# Patient Record
Sex: Male | Born: 1980 | Race: White | Hispanic: No | Marital: Married | Smoking: Former smoker
Health system: Southern US, Community
[De-identification: ages and names within clinical notes are randomized; demographics above are authoritative.]

## PROBLEM LIST (undated history)

## (undated) DIAGNOSIS — K219 Gastro-esophageal reflux disease without esophagitis: Secondary | ICD-10-CM

## (undated) DIAGNOSIS — M7030 Other bursitis of elbow, unspecified elbow: Secondary | ICD-10-CM

## (undated) DIAGNOSIS — L409 Psoriasis, unspecified: Secondary | ICD-10-CM

## (undated) DIAGNOSIS — Z872 Personal history of diseases of the skin and subcutaneous tissue: Secondary | ICD-10-CM

## (undated) DIAGNOSIS — T7840XA Allergy, unspecified, initial encounter: Secondary | ICD-10-CM

## (undated) DIAGNOSIS — B019 Varicella without complication: Secondary | ICD-10-CM

## (undated) HISTORY — DX: Personal history of diseases of the skin and subcutaneous tissue: Z87.2

## (undated) HISTORY — PX: CYSTECTOMY: SUR359

## (undated) HISTORY — DX: Varicella without complication: B01.9

## (undated) HISTORY — DX: Other bursitis of elbow, unspecified elbow: M70.30

## (undated) HISTORY — DX: Allergy, unspecified, initial encounter: T78.40XA

## (undated) HISTORY — DX: Psoriasis, unspecified: L40.9

---
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
## 2002-09-20 ENCOUNTER — Inpatient Hospital Stay (HOSPITAL_COMMUNITY)
Admission: RE | Admit: 2002-09-20 | Discharge: 2002-10-27 | Payer: Self-pay | Admitting: Physical Medicine & Rehabilitation

## 2002-09-21 ENCOUNTER — Encounter: Payer: Self-pay | Admitting: Physical Medicine & Rehabilitation

## 2002-09-26 ENCOUNTER — Encounter: Payer: Self-pay | Admitting: Physical Medicine & Rehabilitation

## 2002-09-27 ENCOUNTER — Encounter: Payer: Self-pay | Admitting: Physical Medicine & Rehabilitation

## 2002-09-30 ENCOUNTER — Encounter: Payer: Self-pay | Admitting: Neurosurgery

## 2002-10-14 ENCOUNTER — Encounter: Payer: Self-pay | Admitting: Physical Medicine & Rehabilitation

## 2002-10-24 ENCOUNTER — Encounter: Payer: Self-pay | Admitting: Neurosurgery

## 2002-11-01 ENCOUNTER — Encounter
Admission: RE | Admit: 2002-11-01 | Discharge: 2003-01-11 | Payer: Self-pay | Admitting: Physical Medicine & Rehabilitation

## 2002-12-30 ENCOUNTER — Encounter
Admission: RE | Admit: 2002-12-30 | Discharge: 2003-03-30 | Payer: Self-pay | Admitting: Physical Medicine & Rehabilitation

## 2003-01-11 ENCOUNTER — Ambulatory Visit (HOSPITAL_COMMUNITY)
Admission: RE | Admit: 2003-01-11 | Discharge: 2003-01-11 | Payer: Self-pay | Admitting: Physical Medicine & Rehabilitation

## 2003-01-12 ENCOUNTER — Encounter
Admission: RE | Admit: 2003-01-12 | Discharge: 2003-04-12 | Payer: Self-pay | Admitting: Physical Medicine & Rehabilitation

## 2004-08-16 ENCOUNTER — Ambulatory Visit: Payer: Self-pay | Admitting: Physical Medicine & Rehabilitation

## 2004-08-16 ENCOUNTER — Encounter
Admission: RE | Admit: 2004-08-16 | Discharge: 2004-11-14 | Payer: Self-pay | Admitting: Physical Medicine & Rehabilitation

## 2008-08-11 ENCOUNTER — Ambulatory Visit: Payer: Self-pay | Admitting: Family Medicine

## 2011-01-15 ENCOUNTER — Ambulatory Visit: Payer: Self-pay | Admitting: Gastroenterology

## 2012-08-06 ENCOUNTER — Encounter: Payer: Self-pay | Admitting: *Deleted

## 2012-08-30 ENCOUNTER — Other Ambulatory Visit: Payer: Self-pay | Admitting: General Surgery

## 2012-08-30 ENCOUNTER — Encounter: Payer: Self-pay | Admitting: General Surgery

## 2012-08-30 ENCOUNTER — Ambulatory Visit (INDEPENDENT_AMBULATORY_CARE_PROVIDER_SITE_OTHER): Payer: BC Managed Care – PPO | Admitting: General Surgery

## 2012-08-30 VITALS — BP 124/72 | HR 76 | Resp 12 | Ht 72.0 in | Wt 222.0 lb

## 2012-08-30 DIAGNOSIS — L0591 Pilonidal cyst without abscess: Secondary | ICD-10-CM

## 2012-08-30 NOTE — Patient Instructions (Addendum)
Patient to have an pilonidal cyst removed.  Patient's surgery has been scheduled for 09-10-12 at [facility].

## 2012-08-30 NOTE — Progress Notes (Signed)
Patient ID: Marcus Clayton, male   DOB: [birth year], 32 y.o.   MRN: [id number]  Chief Complaint  Patient presents with  . Other    pilonidal cyst    HPI Marcus Clayton is a 32 y.o. male here today for an evaluation of an pilonidal cyst.  He states its been there for about an year. He has had it drained in the past the last time was two months ago.   tthe patient reports the first episode of inflammation occurred 2 years ago and was accompanied by spontaneous rupture drainage. He had a recurrent episode one year ago required incision and drainage. A minor flareup occurred a few months ago. 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 The patient was accompanied today by his wife for 4 years (elementary school music teacher in [employer])  was present for the interview and exam.   HPI  History reviewed. No pertinent past medical history.  Past Surgical History  Procedure Laterality Date  . Lung collapsed  2000    No family history on file.  Social History History  Substance Use Topics  . Smoking status: Never Smoker   . Smokeless tobacco: Never Used  . Alcohol Use: No    No Known Allergies  Current Outpatient Prescriptions  Medication Sig Dispense Refill  . fluticasone (FLONASE) 50 MCG/ACT nasal spray Place 2 sprays into the nose as needed for rhinitis.      . Pseudoephedrine HCl (SINUS & ALLERGY 12 HOUR PO) Take 1 tablet by mouth as needed.       No current facility-administered medications for this visit.    Review of Systems Review of Systems  Constitutional: Negative.   Respiratory: Negative.   Cardiovascular: Negative.   Gastrointestinal: Negative.     Blood pressure 124/72, pulse 76, resp. rate 12, height 6' (1.829 m), weight 222 lb (100.699 kg).  Physical Exam Physical Exam  Constitutional: He is oriented to person, place, and time. He appears well-developed and well-nourished.  Cardiovascular: Normal rate, regular  rhythm and normal heart sounds.   Pulmonary/Chest: Breath sounds normal.  Genitourinary:  Pilonidal cyst  Healed well. multiple pits   Lymphadenopathy:    He has no cervical adenopathy.  Neurological: He is alert and oriented to person, place, and time.  Skin: Skin is warm and dry.       Data Reviewed Walk-in clinic notes of August 03, 2012 describes early abscess on the right buttock treated with Septra DS.  Notes of August 06, 2012 described incision and drainage being completed.  Assessment    Pilonidal cyst with recurrent abscesses.     Plan    Indication for operative excision of the natal cleft and closure of the defect was reviewed. The patient may well benefit from flap closure to minimize the risk of recurrence.    Patient's surgery has been scheduled for 09-10-12 at [facility].   Earline Mayotte 08/30/2012, 7:12 PM

## 2012-09-10 ENCOUNTER — Ambulatory Visit: Payer: Self-pay | Admitting: General Surgery

## 2012-09-10 DIAGNOSIS — L0501 Pilonidal cyst with abscess: Secondary | ICD-10-CM

## 2012-09-10 HISTORY — PX: PILONIDAL CYST EXCISION: SHX744

## 2012-09-17 ENCOUNTER — Encounter: Payer: Self-pay | Admitting: General Surgery

## 2012-09-22 ENCOUNTER — Encounter: Payer: Self-pay | Admitting: General Surgery

## 2012-09-22 ENCOUNTER — Ambulatory Visit (INDEPENDENT_AMBULATORY_CARE_PROVIDER_SITE_OTHER): Payer: BC Managed Care – PPO | Admitting: General Surgery

## 2012-09-22 VITALS — BP 98/68 | HR 84 | Resp 12 | Ht 72.0 in | Wt 224.0 lb

## 2012-09-22 DIAGNOSIS — L0591 Pilonidal cyst without abscess: Secondary | ICD-10-CM

## 2012-09-22 NOTE — Progress Notes (Signed)
Patient ID: Marcus Clayton, male   DOB: 14-Nov-1980, 32 y.o.   MRN: [id number]  Chief Complaint  Patient presents with  . Routine Post Op    pilonidal cyst    HPI Marcus Clayton is a 32 y.o. male here for his post op pilonidal cyst done on 09/10/12. Patient states he is better.  He states he has not noticed any drainage. HPI  History reviewed. No pertinent past medical history.  Past Surgical History  Procedure Laterality Date  . Lung collapsed  2000    No family history on file.  Social History History  Substance Use Topics  . Smoking status: Never Smoker   . Smokeless tobacco: Never Used  . Alcohol Use: No    No Known Allergies  Current Outpatient Prescriptions  Medication Sig Dispense Refill  . fluticasone (FLONASE) 50 MCG/ACT nasal spray Place 2 sprays into the nose as needed for rhinitis.      . Pseudoephedrine HCl (SINUS & ALLERGY 12 HOUR PO) Take 1 tablet by mouth as needed.       No current facility-administered medications for this visit.    Review of Systems Review of Systems  Constitutional: Negative.   Respiratory: Negative.   Cardiovascular: Negative.     Blood pressure 98/68, pulse 84, resp. rate 12, height 6' (1.829 m), weight 224 lb (101.606 kg).  Physical Exam Physical Exam  Constitutional: He is oriented to person, place, and time. He appears well-developed and well-nourished.  Neurological: He is alert and oriented to person, place, and time.  Skin: Skin is warm and dry.  Well healed area Sutures removed    Data Reviewed Pathology showed a pilonidal cyst.  Assessment    Doing well status post rotation flap coverage a pilonidal cyst.    Plan    Final followup in 3 weeks. Sutures were removed by the CMA.       Earline Mayotte 09/24/2012, 5:30 PM

## 2012-09-22 NOTE — Patient Instructions (Addendum)
Call for questions or concerns May return to work

## 2012-09-24 ENCOUNTER — Encounter: Payer: Self-pay | Admitting: General Surgery

## 2012-10-04 ENCOUNTER — Encounter: Payer: Self-pay | Admitting: General Surgery

## 2012-10-05 ENCOUNTER — Ambulatory Visit (INDEPENDENT_AMBULATORY_CARE_PROVIDER_SITE_OTHER): Payer: BC Managed Care – PPO | Admitting: General Surgery

## 2012-10-05 ENCOUNTER — Encounter: Payer: Self-pay | Admitting: General Surgery

## 2012-10-05 VITALS — BP 112/74 | HR 82 | Resp 14 | Ht 72.0 in | Wt 227.0 lb

## 2012-10-05 DIAGNOSIS — L0591 Pilonidal cyst without abscess: Secondary | ICD-10-CM

## 2012-10-05 NOTE — Progress Notes (Signed)
aPatient ID: Marcus Clayton, male   DOB: July 14, 1980, 32 y.o.   MRN: [id number]  Chief Complaint  Patient presents with  . Follow-up    pilonidal cyst    HPI Marcus Clayton is a 32 y.o. male  For follow up from a pilonidal cyst repair done on 09-10-12. The patient had a follow up appointment with suture removal on 09/22/12. The patient states that he has developed some swelling on either side of the incision area about 3 days ago. He states the areas are red, swollen, and painful.  HPI  History reviewed. No pertinent past medical history.  Past Surgical History  Procedure Laterality Date  . Lung collapsed  2000  . Pilonidal cyst excision  09/10/12    No family history on file.  Social History History  Substance Use Topics  . Smoking status: Never Smoker   . Smokeless tobacco: Never Used  . Alcohol Use: No    No Known Allergies  Current Outpatient Prescriptions  Medication Sig Dispense Refill  . fluticasone (FLONASE) 50 MCG/ACT nasal spray Place 2 sprays into the nose as needed for rhinitis.      . Pseudoephedrine HCl (SINUS & ALLERGY 12 HOUR PO) Take 1 tablet by mouth as needed.       No current facility-administered medications for this visit.    Review of Systems Review of Systems  Constitutional: Negative.   Respiratory: Negative.   Cardiovascular: Negative.     Blood pressure 112/74, pulse 82, resp. rate 14, height 6' (1.829 m), weight 227 lb (102.967 kg).  Physical Exam Physical Exam  Constitutional: He is oriented to person, place, and time. He appears well-developed and well-nourished.  Neurological: He is alert and oriented to person, place, and time.  Examination of the pilonidal cyst excision site shows mild residual edema superiorly, but no erythema, induration or fluctuance. Excellent healing of the wound is appreciated.    Assessment    No evidence of complication status post pilonidal cyst excision.     Plan    The patient will follow up as  previously scheduled later in the month. Local heat may be applied for comfort if needed.        Marcus Clayton 10/06/2012, 10:19 PM

## 2012-10-05 NOTE — Patient Instructions (Signed)
Follow up as scheduled.  

## 2012-10-06 ENCOUNTER — Encounter: Payer: Self-pay | Admitting: General Surgery

## 2012-10-13 ENCOUNTER — Encounter: Payer: Self-pay | Admitting: General Surgery

## 2012-10-13 ENCOUNTER — Ambulatory Visit (INDEPENDENT_AMBULATORY_CARE_PROVIDER_SITE_OTHER): Payer: BC Managed Care – PPO | Admitting: General Surgery

## 2012-10-13 VITALS — BP 110/78 | HR 86 | Resp 14 | Ht 72.0 in | Wt 226.0 lb

## 2012-10-13 DIAGNOSIS — L0591 Pilonidal cyst without abscess: Secondary | ICD-10-CM

## 2012-10-13 NOTE — Patient Instructions (Addendum)
Patient to return as needed. 

## 2012-10-13 NOTE — Progress Notes (Signed)
Patient ID: Marcus Clayton, male   DOB: [birth year], 32 y.o.   MRN: [id number]  Chief Complaint  Patient presents with  . Routine Post Op    pilonidal cyst    HPI Marcus Clayton is a 32 y.o. male follow up from a pilonidal cyst repair done on 09-10-12. The patient had a follow up appointment with suture removal on 09/22/12. Patient states no problems at this time.   HPI  No past medical history on file.  Past Surgical History  Procedure Laterality Date  . Lung collapsed  2000  . Pilonidal cyst excision  09/10/12  . Cystectomy  09/10/12.    No family history on file.  Social History History  Substance Use Topics  . Smoking status: Never Smoker   . Smokeless tobacco: Never Used  . Alcohol Use: No    No Known Allergies  Current Outpatient Prescriptions  Medication Sig Dispense Refill  . fluticasone (FLONASE) 50 MCG/ACT nasal spray Place 2 sprays into the nose as needed for rhinitis.      . Pseudoephedrine HCl (SINUS & ALLERGY 12 HOUR PO) Take 1 tablet by mouth as needed.       No current facility-administered medications for this visit.    Review of Systems Review of Systems  Blood pressure 110/78, pulse 86, resp. rate 14, height 6' (1.829 m), weight 226 lb (102.513 kg).  Physical Exam Physical Exam Examination shows excellent healing of the pilonidal cyst excision site. No induration, thickening or drainage. Data Reviewed Pathology previously reviewed and benign.  Assessment    Doing well status post Tylenol cyst excision.    Plan    Patient will followup on an as-needed basis.       Earline Mayotte 10/14/2012, 6:00 PM

## 2013-09-28 ENCOUNTER — Ambulatory Visit: Payer: Self-pay | Admitting: Urology

## 2014-05-12 NOTE — Op Note (Signed)
PATIENT NAME:  Marcus Clayton, Marcus Clayton MR#:  [id number] DATE OF BIRTH:  1980-12-10  DATE OF PROCEDURE:  09/10/2012  PREOPERATIVE DIAGNOSIS: Pilonidal cyst.   POSTOPERATIVE DIAGNOSIS: Pilonidal cyst.   OPERATIVE PROCEDURE: Pilonidal cystectomy with rotation flap coverage.   SURGEON: Robert Bellow, M.D.   ANESTHESIA: General endotracheal under Dr. Benjamine Mola, Marcaine 0.5% with 1:200,000 units local infiltration.   ESTIMATED BLOOD LOSS: Minimal.   CLINICAL NOTE: This 34 year old male has had several episodes of pilonidal cyst infection. Examination showed multiple pits extending over a 4 to 5 cm distance of the natal cleft. He was felt to be a candidate for pilonidal cystectomy. He received Invanz prior to the procedure.   OPERATIVE NOTE: With the patient under adequate general endotracheal anesthesia and rolled into the prone position with appropriate padding, the edges of the natal cleft were marked when apposed and then the buttocks taped apart. The area was prepped with Betadine solution and draped. Marcaine was infiltrated for postoperative analgesia. It was elected to excise the skin of the natal cleft and eventually a 2.5 cm area of additional skin to allow movement of the incision to the right of the midline and obliterate the depth of the natal cleft. Skin was incised sharply and hemostasis achieved with electrocautery. This was carried down to the presacral fascia. No abscesses were identified. The skin was mobilized approximately 4 cm on either side of the midline and the redundant skin excised after removing the tape from the buttock skin. The adipose tissue was approximated with interrupted 2-0 Vicryl figure-of-eight sutures. The skin was then approximated with a running 3-0 Vicryl subcuticular suture. Additional 5-0 nylon sutures were used for final apposition. Telfa and Tegaderm dressing was applied and the patient taken to the recovery room in stable condition.    ____________________________ Robert Bellow, MD jwb:gb D: 09/10/2012 19:35:18 ET T: 09/10/2012 21:17:02 ET JOB#: [id number]  cc: Robert Bellow, MD, <Dictator> Milinda Pointer. Jacqualine Code, MD Tashon Capp Amedeo Kinsman MD ELECTRONICALLY SIGNED 09/13/2012 13:11

## 2015-02-14 ENCOUNTER — Other Ambulatory Visit: Payer: Self-pay | Admitting: Family Medicine

## 2015-02-14 ENCOUNTER — Encounter: Payer: Self-pay | Admitting: Family Medicine

## 2015-02-14 ENCOUNTER — Ambulatory Visit (INDEPENDENT_AMBULATORY_CARE_PROVIDER_SITE_OTHER): Payer: BC Managed Care – PPO | Admitting: Family Medicine

## 2015-02-14 VITALS — BP 126/82 | HR 77 | Temp 98.1°F | Ht 72.0 in | Wt 206.2 lb

## 2015-02-14 DIAGNOSIS — R413 Other amnesia: Secondary | ICD-10-CM | POA: Insufficient documentation

## 2015-02-14 DIAGNOSIS — Z872 Personal history of diseases of the skin and subcutaneous tissue: Secondary | ICD-10-CM | POA: Insufficient documentation

## 2015-02-14 DIAGNOSIS — Z130 Encounter for screening for diseases of the blood and blood-forming organs and certain disorders involving the immune mechanism: Secondary | ICD-10-CM

## 2015-02-14 DIAGNOSIS — Z113 Encounter for screening for infections with a predominantly sexual mode of transmission: Secondary | ICD-10-CM

## 2015-02-14 DIAGNOSIS — Z833 Family history of diabetes mellitus: Secondary | ICD-10-CM | POA: Diagnosis not present

## 2015-02-14 DIAGNOSIS — Z87820 Personal history of traumatic brain injury: Secondary | ICD-10-CM | POA: Insufficient documentation

## 2015-02-14 DIAGNOSIS — E663 Overweight: Secondary | ICD-10-CM

## 2015-02-14 DIAGNOSIS — B356 Tinea cruris: Secondary | ICD-10-CM | POA: Diagnosis not present

## 2015-02-14 DIAGNOSIS — Z8709 Personal history of other diseases of the respiratory system: Secondary | ICD-10-CM

## 2015-02-14 DIAGNOSIS — Z0000 Encounter for general adult medical examination without abnormal findings: Secondary | ICD-10-CM

## 2015-02-14 DIAGNOSIS — Z13220 Encounter for screening for lipoid disorders: Secondary | ICD-10-CM | POA: Diagnosis not present

## 2015-02-14 DIAGNOSIS — Z0001 Encounter for general adult medical examination with abnormal findings: Secondary | ICD-10-CM | POA: Insufficient documentation

## 2015-02-14 DIAGNOSIS — D179 Benign lipomatous neoplasm, unspecified: Secondary | ICD-10-CM | POA: Insufficient documentation

## 2015-02-14 HISTORY — DX: Personal history of diseases of the skin and subcutaneous tissue: Z87.2

## 2015-02-14 HISTORY — DX: Personal history of other diseases of the respiratory system: Z87.09

## 2015-02-14 LAB — COMPREHENSIVE METABOLIC PANEL
ALBUMIN: 4.4 g/dL (ref 3.5–5.2)
ALK PHOS: 54 U/L (ref 39–117)
ALT: 32 U/L (ref 0–53)
AST: 22 U/L (ref 0–37)
BUN: 11 mg/dL (ref 6–23)
CO2: 27 mEq/L (ref 19–32)
Calcium: 9.2 mg/dL (ref 8.4–10.5)
Chloride: 108 mEq/L (ref 96–112)
Creatinine, Ser: 0.91 mg/dL (ref 0.40–1.50)
GFR: 102.23 mL/min (ref >60.00)
Glucose, Bld: 96 mg/dL (ref 70–99)
POTASSIUM: 4.2 meq/L (ref 3.5–5.1)
Sodium: 141 mEq/L (ref 135–145)
TOTAL PROTEIN: 7 g/dL (ref 6.0–8.3)
Total Bilirubin: 0.6 mg/dL (ref 0.2–1.2)

## 2015-02-14 LAB — LIPID PANEL
CHOLESTEROL: 137 mg/dL (ref 0–200)
HDL: 37.1 mg/dL — AB (ref >39.00)
LDL Cholesterol: 69 mg/dL (ref 0–99)
NonHDL: 100.07
Total CHOL/HDL Ratio: 4
Triglycerides: 157 mg/dL — ABNORMAL HIGH (ref 0.0–149.0)
VLDL: 31.4 mg/dL (ref 0.0–40.0)

## 2015-02-14 LAB — CBC
HEMATOCRIT: 45.7 % (ref 39.0–52.0)
HEMOGLOBIN: 15.1 g/dL (ref 13.0–17.0)
MCHC: 33 g/dL (ref 30.0–36.0)
MCV: 85.8 fl (ref 78.0–100.0)
Platelets: 191 10*3/uL (ref 150.0–400.0)
RBC: 5.32 Mil/uL (ref 4.22–5.81)
RDW: 12.7 % (ref 11.5–15.5)
WBC: 6.5 10*3/uL (ref 4.0–10.5)

## 2015-02-14 LAB — HEMOGLOBIN A1C: Hgb A1c MFr Bld: 5.4 % (ref 4.6–6.5)

## 2015-02-14 NOTE — Patient Instructions (Signed)
We will call with your labs.  Follow up annually.  We will be in touch regarding your referral.  Take care  Dr. Lacinda Axon   Health Maintenance, Male A healthy lifestyle and preventative care can promote health and wellness.  Maintain regular health, dental, and eye exams.  Eat a healthy diet. Foods like vegetables, fruits, whole grains, low-fat dairy products, and lean protein foods contain the nutrients you need and are low in calories. Decrease your intake of foods high in solid fats, added sugars, and salt. Get information about a proper diet from your health care provider, if necessary.  Regular physical exercise is one of the most important things you can do for your health. Most adults should get at least 150 minutes of moderate-intensity exercise (any activity that increases your heart rate and causes you to sweat) each week. In addition, most adults need muscle-strengthening exercises on 2 or more days a week.   Maintain a healthy weight. The body mass index (BMI) is a screening tool to identify possible weight problems. It provides an estimate of body fat based on height and weight. Your health care provider can find your BMI and can help you achieve or maintain a healthy weight. For males 20 years and older:  A BMI below 18.5 is considered underweight.  A BMI of 18.5 to 24.9 is normal.  A BMI of 25 to 29.9 is considered overweight.  A BMI of 30 and above is considered obese.  Maintain normal blood lipids and cholesterol by exercising and minimizing your intake of saturated fat. Eat a balanced diet with plenty of fruits and vegetables. Blood tests for lipids and cholesterol should begin at age 17 and be repeated every 5 years. If your lipid or cholesterol levels are high, you are over age 59, or you are at high risk for heart disease, you may need your cholesterol levels checked more frequently.Ongoing high lipid and cholesterol levels should be treated with medicines if diet and  exercise are not working.  If you smoke, find out from your health care provider how to quit. If you do not use tobacco, do not start.  Lung cancer screening is recommended for adults aged 48-80 years who are at high risk for developing lung cancer because of a history of smoking. A yearly low-dose CT scan of the lungs is recommended for people who have at least a 30-pack-year history of smoking and are current smokers or have quit within the past 15 years. A pack year of smoking is smoking an average of 1 pack of cigarettes a day for 1 year (for example, a 30-pack-year history of smoking could mean smoking 1 pack a day for 30 years or 2 packs a day for 15 years). Yearly screening should continue until the smoker has stopped smoking for at least 15 years. Yearly screening should be stopped for people who develop a health problem that would prevent them from having lung cancer treatment.  If you choose to drink alcohol, do not have more than 2 drinks per day. One drink is considered to be 12 oz (360 mL) of beer, 5 oz (150 mL) of wine, or 1.5 oz (45 mL) of liquor.  Avoid the use of street drugs. Do not share needles with anyone. Ask for help if you need support or instructions about stopping the use of drugs.  High blood pressure causes heart disease and increases the risk of stroke. High blood pressure is more likely to develop in:  People who  have blood pressure in the end of the normal range (100-139/85-89 mm Hg).  People who are overweight or obese.  People who are African American.  If you are 53-65 years of age, have your blood pressure checked every 3-5 years. If you are 17 years of age or older, have your blood pressure checked every year. You should have your blood pressure measured twice--once when you are at a hospital or clinic, and once when you are not at a hospital or clinic. Record the average of the two measurements. To check your blood pressure when you are not at a hospital or  clinic, you can use:  An automated blood pressure machine at a pharmacy.  A home blood pressure monitor.  If you are 26-18 years old, ask your health care provider if you should take aspirin to prevent heart disease.  Diabetes screening involves taking a blood sample to check your fasting blood sugar level. This should be done once every 3 years after age 67 if you are at a normal weight and without risk factors for diabetes. Testing should be considered at a younger age or be carried out more frequently if you are overweight and have at least 1 risk factor for diabetes.  Colorectal cancer can be detected and often prevented. Most routine colorectal cancer screening begins at the age of 88 and continues through age 18. However, your health care provider may recommend screening at an earlier age if you have risk factors for colon cancer. On a yearly basis, your health care provider may provide home test kits to check for hidden blood in the stool. A small camera at the end of a tube may be used to directly examine the colon (sigmoidoscopy or colonoscopy) to detect the earliest forms of colorectal cancer. Talk to your health care provider about this at age 29 when routine screening begins. A direct exam of the colon should be repeated every 5-10 years through age 67, unless early forms of precancerous polyps or small growths are found.  People who are at an increased risk for hepatitis B should be screened for this virus. You are considered at high risk for hepatitis B if:  You were born in a country where hepatitis B occurs often. Talk with your health care provider about which countries are considered high risk.  Your parents were born in a high-risk country and you have not received a shot to protect against hepatitis B (hepatitis B vaccine).  You have HIV or AIDS.  You use needles to inject street drugs.  You live with, or have sex with, someone who has hepatitis B.  You are a man who has  sex with other men (MSM).  You get hemodialysis treatment.  You take certain medicines for conditions like cancer, organ transplantation, and autoimmune conditions.  Hepatitis C blood testing is recommended for all people born from [birth year] through 1963 and any individual with known risk factors for hepatitis C.  Healthy men should no longer receive prostate-specific antigen (PSA) blood tests as part of routine cancer screening. Talk to your health care provider about prostate cancer screening.  Testicular cancer screening is not recommended for adolescents or adult males who have no symptoms. Screening includes self-exam, a health care provider exam, and other screening tests. Consult with your health care provider about any symptoms you have or any concerns you have about testicular cancer.  Practice safe sex. Use condoms and avoid high-risk sexual practices to reduce the spread of sexually transmitted  infections (STIs).  You should be screened for STIs, including gonorrhea and chlamydia if:  You are sexually active and are younger than 24 years.  You are older than 24 years, and your health care provider tells you that you are at risk for this type of infection.  Your sexual activity has changed since you were last screened, and you are at an increased risk for chlamydia or gonorrhea. Ask your health care provider if you are at risk.  If you are at risk of being infected with HIV, it is recommended that you take a prescription medicine daily to prevent HIV infection. This is called pre-exposure prophylaxis (PrEP). You are considered at risk if:  You are a man who has sex with other men (MSM).  You are a heterosexual man who is sexually active with multiple partners.  You take drugs by injection.  You are sexually active with a partner who has HIV.  Talk with your health care provider about whether you are at high risk of being infected with HIV. If you choose to begin PrEP, you should  first be tested for HIV. You should then be tested every 3 months for as long as you are taking PrEP.  Use sunscreen. Apply sunscreen liberally and repeatedly throughout the day. You should seek shade when your shadow is shorter than you. Protect yourself by wearing long sleeves, pants, a wide-brimmed hat, and sunglasses year round whenever you are outdoors.  Tell your health care provider of new moles or changes in moles, especially if there is a change in shape or color. Also, tell your health care provider if a mole is larger than the size of a pencil eraser.  A one-time screening for abdominal aortic aneurysm (AAA) and surgical repair of large AAAs by ultrasound is recommended for men aged 33-75 years who are current or former smokers.  Stay current with your vaccines (immunizations).   This information is not intended to replace advice given to you by your health care provider. Make sure you discuss any questions you have with your health care provider.   Document Released: 07/05/2007 Document Revised: 01/27/2014 Document Reviewed: 06/03/2010 Elsevier Interactive Patient Education [employer].

## 2015-02-14 NOTE — Assessment & Plan Note (Addendum)
No rash noted at this time. I advised PRN use of Ketoconazole cream.

## 2015-02-14 NOTE — Progress Notes (Signed)
Subjective:  Patient ID: Marcus Clayton, male    DOB: Oct 05, 1980  Age: 35 y.o. MRN: [id number]  CC: Establish care, Rash  HPI [name] is a 35 y.o. male presents to the clinic today to establish care.  Preventative Healthcare  Immunizations  Tetanus - was approximately 2 years ago. Up-to-date.  Pneumococcal - not indicated.  Flu - patient had flu shot earlier this year  Labs: screening labs today.  Exercise: no regular exercise.  Alcohol use: no.  Smoking/tobacco use: Former smoker.  STD/HIV testing: patient reports that he has multiple sexual partners and does not always use protection. He desires STD screening today.  Regular dental exams: yes.  Wears seat belt: .  Rash  Patient reports that he has had a rash in his groin for months.  He was seen for this at [facility] clinic. I reviewed the notes. Summarization: Exam was consistent with tinea cruris. He was treated with oral Diflucan and topical ketoconazole.  He reports that he had improvement with treatment but feels like is not completely resolved.  No associated itching.  Exacerbated by warmth/moisture.  He had relief with the above treatment.  No other areas affected.  No other complaints.  PMH, Surgical Hx, Family Hx, Social History reviewed and updated as below.  Past Medical History  Diagnosis Date  . Chicken pox   . Allergy    Past Surgical History  Procedure Laterality Date  . Lung collapsed  2000  . Pilonidal cyst excision  09/10/12  . Cystectomy  09/10/12.   Family History  Problem Relation Age of Onset  . Diabetes Mother   . Diabetes Paternal Uncle   . Diabetes Paternal Grandmother   . Diabetes Paternal Uncle    Social History  Substance Use Topics  . Smoking status: Former Research scientist (life sciences)  . Smokeless tobacco: Never Used  . Alcohol Use: No   Review of Systems  Skin: Positive for rash.  Neurological:       Memory difficulties.  All other systems reviewed and  are negative.   Objective:   Today's Vitals: BP 126/82 mmHg  Pulse 77  Temp(Src) 98.1 F (36.7 C) (Oral)  Ht 6' (1.829 m)  Wt 206 lb 4 oz (93.554 kg)  BMI 27.97 kg/m2  SpO2 98%  Physical Exam  Constitutional: He is oriented to person, place, and time. He appears well-developed and well-nourished. No distress.  HENT:  Head: Normocephalic and atraumatic.  Nose: Nose normal.  Mouth/Throat: Oropharynx is clear and moist. No oropharyngeal exudate.  Normal TM's bilaterally.   Eyes: Conjunctivae are normal. No scleral icterus.  Neck: Neck supple.  Cardiovascular: Normal rate and regular rhythm.   No murmur heard. Pulmonary/Chest: Effort normal and breath sounds normal. He has no wheezes. He has no rales.  Abdominal: Soft. He exhibits no distension. There is no tenderness. There is no rebound and no guarding.  Musculoskeletal: Normal range of motion. He exhibits no edema.  Lymphadenopathy:    He has no cervical adenopathy.  Neurological: He is alert and oriented to person, place, and time.  Skin:  Groin - no rash noted. Patient does have numerous palpable masses under the skin. Located on the arms, legs. Mobile and mildly firm to palpation. Clinically appear consistent with lipomas.  Psychiatric: He has a normal mood and affect.  Vitals reviewed.  Assessment & Plan:   Problem List Items Addressed This Visit    Preventative health care - Primary    Screening labs today. See orders.  Flu shot and tetanus immunization up-to-date. STD screening today.       Tinea cruris    No rash noted at this time. I advised PRN use of Ketoconazole cream.      Multiple lipomas    Noted on exam. Patient would like to (potentially) have some of them removed. Will place referral.      Relevant Orders   Ambulatory referral to Dermatology    Other Visit Diagnoses    Screening for deficiency anemia        Relevant Orders    CBC    Overweight (BMI 25.0-29.9)        Relevant Orders     Comp Met (CMET)    Screening, lipid        Relevant Orders    Lipid panel    Family history of diabetes mellitus (DM)        Relevant Orders    HgB A1c    Screening for STD (sexually transmitted disease)        Relevant Orders    GC/chlamydia probe amp, urine    HIV antibody (with reflex)    RPR       Outpatient Encounter Prescriptions as of 02/14/2015  Medication Sig  . fluticasone (FLONASE) 50 MCG/ACT nasal spray Place 2 sprays into the nose as needed for rhinitis.  Marland Kitchen triamcinolone cream (KENALOG) 0.1 % Apply topically.  . [DISCONTINUED] ketoconazole (NIZORAL) 2 % cream Apply topically.  . [DISCONTINUED] Pseudoephedrine HCl (SINUS & ALLERGY 12 HOUR PO) Take 1 tablet by mouth as needed.   No facility-administered encounter medications on file as of 02/14/2015.   Follow-up: Annually  [place]

## 2015-02-14 NOTE — Assessment & Plan Note (Signed)
Screening labs today. See orders. Flu shot and tetanus immunization up-to-date. STD screening today.

## 2015-02-14 NOTE — Assessment & Plan Note (Signed)
Noted on exam. Patient would like to (potentially) have some of them removed. Will place referral.

## 2015-02-15 LAB — GC/CHLAMYDIA PROBE AMP
CT PROBE, AMP APTIMA: NOT DETECTED
GC PROBE AMP APTIMA: NOT DETECTED

## 2015-02-15 LAB — RPR

## 2015-02-15 LAB — HIV ANTIBODY (ROUTINE TESTING W REFLEX): HIV 1&2 Ab, 4th Generation: NONREACTIVE

## 2015-04-05 ENCOUNTER — Ambulatory Visit (INDEPENDENT_AMBULATORY_CARE_PROVIDER_SITE_OTHER): Payer: BC Managed Care – PPO | Admitting: Family Medicine

## 2015-04-05 ENCOUNTER — Encounter: Payer: Self-pay | Admitting: Family Medicine

## 2015-04-05 VITALS — BP 106/82 | HR 73 | Temp 97.6°F | Ht 72.0 in | Wt 212.5 lb

## 2015-04-05 DIAGNOSIS — L0231 Cutaneous abscess of buttock: Secondary | ICD-10-CM | POA: Diagnosis not present

## 2015-04-05 MED ORDER — DOXYCYCLINE HYCLATE 100 MG PO TABS: 100.0000 mg | ORAL_TABLET | Freq: Two times a day (BID) | ORAL | Status: DC

## 2015-04-05 NOTE — Assessment & Plan Note (Signed)
New problem. Exam concerning for developing abscess. Starting on Doxycycline. Patient to follow up if he fails to improve or worsens.

## 2015-04-05 NOTE — Patient Instructions (Signed)
Take the medication as prescribed.  Use Hibiclens to keep the area clean.  Follow up if it fails to improve or worsens.  Take care  Dr. Lacinda Axon

## 2015-04-05 NOTE — Progress Notes (Signed)
   Subjective:  Patient ID: Marcus Clayton, male    DOB: August 10, 1980  Age: 35 y.o. MRN: [id number]  CC: ? Cyst/abscess  HPI:  35 year old male with a history of pilonidal cyst presents with concerns of a new cyst/abscess.  Patient reports that for the past 2 weeks he has had an area of tenderness on his bottom. It is located on the left buttock in the intergluteal cleft. Area is tender and making sitting somewhat difficult. No reports of drainage from the area. No known exacerbating factors. No interventions or medications tried. No other associated symptoms (fever, chills). No other complaints today.  Social Hx   Social History   Social History  . Marital Status: Married    Spouse Name: N/A  . Number of Children: N/A  . Years of Education: N/A   Social History Main Topics  . Smoking status: Former Research scientist (life sciences)  . Smokeless tobacco: Never Used  . Alcohol Use: No  . Drug Use: No  . Sexual Activity: Yes   Other Topics Concern  . None   Social History Narrative   Review of Systems  Constitutional: Negative.   Skin:       Area of tenderness in the buttocks. ? Cyst or abscess.   Objective:  BP 106/82 mmHg  Pulse 73  Temp(Src) 97.6 F (36.4 C) (Oral)  Ht 6' (1.829 m)  Wt 212 lb 8 oz (96.389 kg)  BMI 28.81 kg/m2  SpO2 97%  BP/Weight 04/05/2015 02/14/2015 0000000  Systolic BP A999333 [id number]  Diastolic BP 82 82 78  Wt. (Lbs) 212.5 206.25 226  BMI 28.81 27.97 30.64   Physical Exam  Constitutional: He is oriented to person, place, and time. He appears well-developed. No distress.  Pulmonary/Chest: Effort normal.  Neurological: He is alert and oriented to person, place, and time.  Skin:  Small area of tenderness on the left medial buttock in the intergluteal region. Feels slightly more firm than surrounding tissue. No discrete area of fluctuance. Mild erythema. No drainage.   Psychiatric: He has a normal mood and affect.  Vitals reviewed.   Lab Results  Component  Value Date   WBC 6.5 02/14/2015   HGB 15.1 02/14/2015   HCT 45.7 02/14/2015   PLT 191.0 02/14/2015   GLUCOSE 96 02/14/2015   CHOL 137 02/14/2015   TRIG 157.0* 02/14/2015   HDL 37.10* 02/14/2015   LDLCALC 69 02/14/2015   ALT 32 02/14/2015   AST 22 02/14/2015   NA 141 02/14/2015   K 4.2 02/14/2015   CL 108 02/14/2015   CREATININE 0.91 02/14/2015   BUN 11 02/14/2015   CO2 27 02/14/2015   HGBA1C 5.4 02/14/2015    Assessment & Plan:   Problem List Items Addressed This Visit    Abscess of buttock - Primary    New problem. Exam concerning for developing abscess. Starting on Doxycycline. Patient to follow up if he fails to improve or worsens.          Meds ordered this encounter  Medications  . doxycycline (VIBRA-TABS) 100 MG tablet    Sig: Take 1 tablet (100 mg total) by mouth 2 (two) times daily.    Dispense:  20 tablet    Refill:  0   Follow-up: PRN  [place]

## 2015-04-05 NOTE — Progress Notes (Signed)
Pre visit review using our clinic review tool, if applicable. No additional management support is needed unless otherwise documented below in the visit note. 

## 2015-07-23 ENCOUNTER — Encounter: Payer: Self-pay | Admitting: Family Medicine

## 2015-07-23 ENCOUNTER — Ambulatory Visit (INDEPENDENT_AMBULATORY_CARE_PROVIDER_SITE_OTHER): Payer: BC Managed Care – PPO | Admitting: Family Medicine

## 2015-07-23 VITALS — BP 110/72 | HR 82 | Temp 97.7°F | Ht 71.5 in | Wt 213.0 lb

## 2015-07-23 DIAGNOSIS — R21 Rash and other nonspecific skin eruption: Secondary | ICD-10-CM | POA: Insufficient documentation

## 2015-07-23 MED ORDER — FLUCONAZOLE 150 MG PO TABS: 150.0000 mg | ORAL_TABLET | Freq: Every day | ORAL | Status: DC

## 2015-07-23 MED ORDER — DOXYCYCLINE HYCLATE 100 MG PO TABS: 100.0000 mg | ORAL_TABLET | Freq: Two times a day (BID) | ORAL | Status: DC

## 2015-07-23 NOTE — Assessment & Plan Note (Addendum)
New problem. Patient with a rash in the inguinal region that appears consistent with folliculitis. Treating with doxycycline. Additionally, patient with maceration and redness between the buttocks. Treating with course of diflucan and topical ketoconazole (patient already has the ketoconazole).

## 2015-07-23 NOTE — Patient Instructions (Signed)
Take the doxycycline as prescribed.  Use the ketoconazole twice daily.  Diflucan for another 10 days.  Take care  Dr. Lacinda Axon

## 2015-07-23 NOTE — Progress Notes (Signed)
   Subjective:  Patient ID: Marcus Clayton, male    DOB: Jun 10, 1980  Age: 35 y.o. MRN: [id number]  CC: Rash  HPI:  35 year old male presents with complaints of rash.  She states that he's had this rash for the past month. He was seen in the local walk-in clinic and was treated with topical triamcinolone and Diflucan. He has not had resolution. He continues to have rash. Rash is located in the inguinal region and between the buttocks. No known exacerbating or relieving factors. No new exposures. 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 No other associated symptoms. No other complaints at this time.  Social Hx   Social History   Social History  . Marital Status: Married    Spouse Name: N/A  . Number of Children: N/A  . Years of Education: N/A   Social History Main Topics  . Smoking status: Former Research scientist (life sciences)  . Smokeless tobacco: Never Used  . Alcohol Use: No  . Drug Use: No  . Sexual Activity: Yes   Other Topics Concern  . None   Social History Narrative   Review of Systems  Constitutional: Negative.   Skin: Positive for rash.    Objective:  BP 110/72 mmHg  Pulse 82  Temp(Src) 97.7 F (36.5 C) (Oral)  Ht 5' 11.5" (1.816 m)  Wt 213 lb (96.616 kg)  BMI 29.30 kg/m2  SpO2 97%  BP/Weight 07/23/2015 04/05/2015 [id number]  Systolic BP A999333 A999333 [id number]  Diastolic BP 72 82 82  Wt. (Lbs) 213 212.5 206.25  BMI 29.3 28.81 27.97   Physical Exam  Constitutional: He is oriented to person, place, and time. He appears well-developed.  Pulmonary/Chest: Effort normal.  Neurological: He is alert and oriented to person, place, and time.  Skin:  Inguinal region with erythematous rash. Pustules noted. Maceration and redness noted between the buttocks.  Psychiatric: He has a normal mood and affect.  Vitals reviewed.  Lab Results  Component Value Date   WBC 6.5 02/14/2015   HGB 15.1 02/14/2015   HCT 45.7 02/14/2015   PLT 191.0 02/14/2015   GLUCOSE 96  02/14/2015   CHOL 137 02/14/2015   TRIG 157.0* 02/14/2015   HDL 37.10* 02/14/2015   LDLCALC 69 02/14/2015   ALT 32 02/14/2015   AST 22 02/14/2015   NA 141 02/14/2015   K 4.2 02/14/2015   CL 108 02/14/2015   CREATININE 0.91 02/14/2015   BUN 11 02/14/2015   CO2 27 02/14/2015   HGBA1C 5.4 02/14/2015    Assessment & Plan:   Problem List Items Addressed This Visit    Rash - Primary    New problem. Patient with a rash in the inguinal region that appears consistent with folliculitis. Treating with doxycycline. Additionally, patient with maceration and redness between the buttocks. Treating with course of diflucan and topical ketoconazole (patient already has the ketoconazole).        Follow-up: PRN  [place]

## 2015-12-31 ENCOUNTER — Ambulatory Visit: Payer: BC Managed Care – PPO | Admitting: Family Medicine

## 2016-03-18 ENCOUNTER — Encounter: Payer: Self-pay | Admitting: *Deleted

## 2016-03-19 ENCOUNTER — Ambulatory Visit (INDEPENDENT_AMBULATORY_CARE_PROVIDER_SITE_OTHER): Payer: BLUE CROSS/BLUE SHIELD | Admitting: General Surgery

## 2016-03-19 ENCOUNTER — Encounter: Payer: Self-pay | Admitting: General Surgery

## 2016-03-19 VITALS — BP 118/70 | HR 80 | Resp 14 | Ht 72.0 in | Wt 234.0 lb

## 2016-03-19 DIAGNOSIS — K603 Anal fistula: Secondary | ICD-10-CM | POA: Diagnosis not present

## 2016-03-19 NOTE — Progress Notes (Signed)
Patient ID: Marcus Clayton, male   DOB: 1980/10/07, 36 y.o.   MRN: [id number]  Chief Complaint  Patient presents with  . Other    pilonidal cyst    HPI Marcus Clayton is a 36 y.o. male here today for a possible perianal skin tag. Patient had a pilonidal cyst excision done on 09/10/12. He states he went to see Dr Evorn Gong and tried some cream which didn't help. He states it forms like a "blister" then it pops and bleeds for about 20 minutes. This happens in different places around rectum. He also had a rash which the creams helped, ketoconazole, lamisil and kenalog. He has been using hydrogen peroxide on the area near the rectum which has helped. He denies pain with BM.   HPI  Past Medical History:  Diagnosis Date  . Allergy   . Chicken pox     Past Surgical History:  Procedure Laterality Date  . CYSTECTOMY  09/10/12.  Marland Kitchen lung collapsed  2000  . PILONIDAL CYST EXCISION  09/10/12    Family History  Problem Relation Age of Onset  . Diabetes Mother   . Diabetes Paternal Uncle   . Diabetes Paternal Grandmother   . Diabetes Paternal Uncle     Social History Social History  Substance Use Topics  . Smoking status: Former Smoker    Quit date: 01/20/2014  . Smokeless tobacco: Never Used  . Alcohol use No    No Known Allergies  No current outpatient prescriptions on file.   No current facility-administered medications for this visit.     Review of Systems Review of Systems  Constitutional: Negative.   Respiratory: Negative.   Cardiovascular: Negative.     Blood pressure 118/70, pulse 80, resp. rate 14, height 6' (1.829 m), weight 234 lb (106.1 kg).  Physical Exam Physical Exam  Constitutional: He is oriented to person, place, and time. He appears well-developed and well-nourished.  HENT:  Mouth/Throat: Oropharynx is clear and moist.  Eyes: Conjunctivae are normal. No scleral icterus.  Neck: Neck supple.  Cardiovascular: Normal rate, regular rhythm and  normal heart sounds.   Pulmonary/Chest: Effort normal and breath sounds normal.  Abdominal: Soft. Bowel sounds are normal. There is no tenderness.  Genitourinary: Prostate normal.     Genitourinary Comments: Anal fistula  Lymphadenopathy:    He has no cervical adenopathy.  Neurological: He is alert and oriented to person, place, and time.  Skin: Skin is warm and dry.  3 cm lipoma right posterior thigh  Psychiatric: His behavior is normal.    Data Reviewed Dermatology notes.  Assessment    Anal fistula.    Plan    Indication for operative repair. This appears to be superficial, likely involving only the superficial sphincter. Low risk for incontinence.    Anal fistula  Patient wishes to contact the office when he would like to proceed with surgery.   This information has been scribed by Karie Fetch RN, BSN,BC. Marland Kitchen   Robert Bellow 03/20/2016, 11:43 AM

## 2016-03-19 NOTE — Patient Instructions (Addendum)
The patient is aware to call back for any questions or concerns. Anal Fistula An anal fistula is an abnormal tunnel that develops between the bowel and the skin near the outside of the anus, where stool (feces) comes out. The anus has many tiny glands that make lubricating fluid. Sometimes, these glands become plugged and infected, and that can cause a fluid-filled pocket (abscess) to form. An anal fistula often develops after this infection or abscess. What are the causes? In most cases, an anal fistula is caused by a past or current anal abscess. Other causes include:  A complication of surgery.  Trauma to the rectal area.  Radiation to the area.  Medical conditions or diseases, such as:  Chronic inflammatory bowel disease, such as Crohn disease or ulcerative colitis.  Colon cancer or rectal cancer.  Diverticular disease, such as diverticulitis.  An STD (sexually transmitted disease), such as gonorrhea, chlamydia, or syphilis.  An infection that is caused by HIV (human immunodeficiency virus).  Foreign body in the rectum. What are the signs or symptoms? Symptoms of this condition include:  Throbbing or constant pain that may be worse while you are sitting.  Swelling or irritation around the anus.  Drainage of pus or blood from an opening near the anus.  Pain with bowel movements.  Fever or chills. How is this diagnosed? Your health care provider will examine the area to find the openings of the anal fistula and the fistula tract. The external opening of the anal fistula may be seen during a physical exam. You may also have tests, including:  An exam of the rectal area with a gloved hand (digital rectal exam).  An exam with a probe or scope to help locate the internal opening of the fistula.  Imaging tests to find the exact location and path of the fistula. These tests may include X-rays, an ultrasound, a CT scan, or MRI. The path is made visible by a dye that is injected  into the fistula opening. You may have other tests to find the cause of the anal fistula. How is this treated? The most common treatment for an anal fistula is surgery. The type of surgery that is used will depend on where the fistula is located and how complex the fistula is. Surgical options include:  A fistulotomy. The whole fistula is opened up, and the contents are drained to promote healing.  Seton placement. A silk string (seton) is placed into the fistula during a fistulotomy. This helps to drain any infection to promote healing.  Advancement flap procedure. Tissue is removed from your rectum or the skin around the anus and is attached to the opening of the fistula.  Bioprosthetic plug. A cone-shaped plug is made from your tissue and is used to block the opening of the fistula. Some anal fistulas do not require surgery. A nonsurgical treatment option involves injecting a fibrin glue to seal the fistula. You also may be prescribed an antibiotic medicine to treat an infection. Follow these instructions at home: Medicines   Take over-the-counter and prescription medicines only as told by your health care provider.  If you were prescribed an antibiotic medicine, take it as told by your health care provider. Do not stop taking the antibiotic even if you start to feel better.  Use a stool softener or a laxative if told to do so by your health care provider. General instructions   Eat a high-fiber diet as told by your health care provider. This can help   to prevent constipation.  Drink enough fluid to keep your urine clear or pale yellow.  Take a warm sitz bath for 15-20 minutes, 3-4 times per day, or as told by your health care provider. Sitz baths can ease your pain and discomfort and help with healing.  Follow good hygiene to keep the anal area as clean and dry as possible. Use wet toilet paper or a moist towelette after each bowel movement.  Keep all follow-up visits as told by  your health care provider. This is important. Contact a health care provider if:  You have increased pain that is not controlled with medicines.  You have new redness or swelling around the anal area.  You have new fluid, blood, or pus coming from the anal area.  You have tenderness or warmth around the anal area. Get help right away if:  You have a fever.  You have severe pain.  You have chills or diarrhea.  You have severe problems urinating or having a bowel movement. This information is not intended to replace advice given to you by your health care provider. Make sure you discuss any questions you have with your health care provider. Document Released: 12/20/2007 Document Revised: 06/14/2015 Document Reviewed: 04/03/2014 Elsevier Interactive Patient Education  2017 [employer]  

## 2016-03-20 ENCOUNTER — Encounter: Payer: Self-pay | Admitting: General Surgery

## 2016-03-20 DIAGNOSIS — K603 Anal fistula: Secondary | ICD-10-CM | POA: Insufficient documentation

## 2016-04-01 ENCOUNTER — Other Ambulatory Visit: Payer: Self-pay | Admitting: General Surgery

## 2016-04-01 DIAGNOSIS — K603 Anal fistula: Secondary | ICD-10-CM

## 2016-04-04 ENCOUNTER — Encounter
Admission: RE | Admit: 2016-04-04 | Discharge: 2016-04-04 | Disposition: A | Discharge status: Home / Self Care | Payer: BLUE CROSS/BLUE SHIELD | Source: Ambulatory Visit | Attending: General Surgery | Admitting: General Surgery

## 2016-04-04 HISTORY — DX: Gastro-esophageal reflux disease without esophagitis: K21.9

## 2016-04-04 NOTE — Patient Instructions (Signed)
  Your procedure is scheduled on: 04-07-16 Report to Same Day Surgery 2nd floor medical mall [facility] Entrance-take elevator on left to 2nd floor.  Check in with surgery information desk.) To find out your arrival time please call [phone] between 1PM - 3PM on 04-04-16  Remember: Instructions that are not followed completely may result in serious medical risk, up to and including death, or upon the discretion of your surgeon and anesthesiologist your surgery may need to be rescheduled.    _x___ 1. Do not eat food or drink liquids after midnight. No gum chewing or                              hard candies.     __x__ 2. No Alcohol for 24 hours before or after surgery.   __x__3. No Smoking for 24 prior to surgery.   ____  4. Bring all medications with you on the day of surgery if instructed.    __x__ 5. Notify your doctor if there is any change in your medical condition     (cold, fever, infections).     Do not wear jewelry, make-up, hairpins, clips or nail polish.  Do not wear lotions, powders, or perfumes. You may wear deodorant.  Do not shave 48 hours prior to surgery. Men may shave face and neck.  Do not bring valuables to the hospital.    [facility] is not responsible for any belongings or valuables.               Contacts, dentures or bridgework may not be worn into surgery.  Leave your suitcase in the car. After surgery it may be brought to your room.  For patients admitted to the hospital, discharge time is determined by your                       treatment team.   Patients discharged the day of surgery will not be allowed to drive home.  You will need someone to drive you home and stay with you the night of your procedure.    Please read over the following fact sheets that you were given:     _x___ Take anti-hypertensive (unless it includes a diuretic), cardiac, seizure, asthma,     anti-reflux and psychiatric medicines. These include:  1.  NONE  2.  3.  4.  5.  6.  _X___Fleets enema or Magnesium Citrate as directed-DO FLEETS ENEMA 1 HOUR PRIOR TO ARRIVAL TIME TO HOSPITAL   ____ Use CHG Soap or sage wipes as directed on instruction sheet   ____ Use inhalers on the day of surgery and bring to hospital day of surgery  ____ Stop Metformin and Janumet 2 days prior to surgery.    ____ Take 1/2 of usual insulin dose the night before surgery and none on the morning     surgery.   ____ Follow recommendations from Cardiologist, Pulmonologist or PCP regarding          stopping Aspirin, Coumadin, Pllavix ,Eliquis, Effient, or Pradaxa, and Pletal.  X____Stop Anti-inflammatories such as Advil, Aleve, Ibuprofen, Motrin, Naproxen, Naprosyn, Goodies powders or aspirin products. OK to take Tylenol    ____ Stop supplements until after surgery.     ____ Bring C-Pap to the hospital.

## 2016-04-06 MED ORDER — FAMOTIDINE 20 MG PO TABS
20.0000 mg | ORAL_TABLET | Freq: Once | ORAL | Status: AC
  Administered 2016-04-07: 20 mg via ORAL

## 2016-04-06 MED ORDER — DEXTROSE 5 % IV SOLN
2.0000 g | INTRAVENOUS | Status: AC
  Filled 2016-04-06: qty 2

## 2016-04-06 MED ORDER — FLEET ENEMA 7-19 GM/118ML RE ENEM
1.0000 | ENEMA | Freq: Once | RECTAL | Status: AC
  Administered 2016-04-07: 1 via RECTAL

## 2016-04-07 ENCOUNTER — Encounter
Admission: RE | Disposition: A | Discharge status: Home / Self Care | Payer: Self-pay | Source: Ambulatory Visit | Attending: General Surgery

## 2016-04-07 ENCOUNTER — Ambulatory Visit: Payer: BLUE CROSS/BLUE SHIELD | Admitting: Certified Registered Nurse Anesthetist

## 2016-04-07 ENCOUNTER — Ambulatory Visit
Admission: RE | Admit: 2016-04-07 | Discharge: 2016-04-07 | Disposition: A | Discharge status: Home / Self Care | Payer: BLUE CROSS/BLUE SHIELD | Source: Ambulatory Visit | Attending: General Surgery | Admitting: General Surgery

## 2016-04-07 DIAGNOSIS — Z833 Family history of diabetes mellitus: Secondary | ICD-10-CM | POA: Diagnosis not present

## 2016-04-07 DIAGNOSIS — E669 Obesity, unspecified: Secondary | ICD-10-CM | POA: Diagnosis not present

## 2016-04-07 DIAGNOSIS — K603 Anal fistula: Secondary | ICD-10-CM | POA: Diagnosis not present

## 2016-04-07 DIAGNOSIS — Z6831 Body mass index (BMI) 31.0-31.9, adult: Secondary | ICD-10-CM | POA: Diagnosis not present

## 2016-04-07 DIAGNOSIS — Z87891 Personal history of nicotine dependence: Secondary | ICD-10-CM | POA: Insufficient documentation

## 2016-04-07 HISTORY — PX: ANAL FISTULOTOMY: SHX6423

## 2016-04-07 SURGERY — ANAL FISTULOTOMY
Anesthesia: General | Wound class: Clean Contaminated

## 2016-04-07 MED ORDER — LACTATED RINGERS IV SOLN
INTRAVENOUS | Status: DC | PRN
  Administered 2016-04-07: 13:00:00 via INTRAVENOUS

## 2016-04-07 MED ORDER — BUPIVACAINE LIPOSOME 1.3 % IJ SUSP
INTRAMUSCULAR | Status: AC
  Filled 2016-04-07: qty 20

## 2016-04-07 MED ORDER — OXYCODONE HCL 5 MG PO TABS: 5.0000 mg | ORAL_TABLET | Freq: Once | ORAL | Status: DC | PRN

## 2016-04-07 MED ORDER — SODIUM CHLORIDE 0.9 % IJ SOLN
INTRAMUSCULAR | Status: AC
  Filled 2016-04-07: qty 50

## 2016-04-07 MED ORDER — FENTANYL CITRATE (PF) 100 MCG/2ML IJ SOLN: 25.0000 ug | INTRAMUSCULAR | Status: DC | PRN

## 2016-04-07 MED ORDER — HYDROCODONE-ACETAMINOPHEN 5-325 MG PO TABS: 1.0000 | ORAL_TABLET | ORAL | 0 refills | Status: DC | PRN

## 2016-04-07 MED ORDER — CEFOTETAN DISODIUM-DEXTROSE 2-2.08 GM-% IV SOLR
2.0000 g | INTRAVENOUS | Status: AC
  Administered 2016-04-07: 2 g via INTRAVENOUS
  Filled 2016-04-07: qty 50

## 2016-04-07 MED ORDER — FENTANYL CITRATE (PF) 100 MCG/2ML IJ SOLN
INTRAMUSCULAR | Status: DC | PRN
  Administered 2016-04-07 (×2): 50 ug via INTRAVENOUS

## 2016-04-07 MED ORDER — MIDAZOLAM HCL 2 MG/2ML IJ SOLN
INTRAMUSCULAR | Status: AC
  Filled 2016-04-07: qty 2

## 2016-04-07 MED ORDER — METHYLENE BLUE 0.5 % INJ SOLN
INTRAVENOUS | Status: AC
  Filled 2016-04-07: qty 10

## 2016-04-07 MED ORDER — KETOROLAC TROMETHAMINE 30 MG/ML IJ SOLN
INTRAMUSCULAR | Status: DC | PRN
  Administered 2016-04-07: 30 mg via INTRAVENOUS

## 2016-04-07 MED ORDER — OXYCODONE HCL 5 MG/5ML PO SOLN
5.0000 mg | Freq: Once | ORAL | Status: DC | PRN
Start: 2016-04-07 — End: 2016-04-07

## 2016-04-07 MED ORDER — LIDOCAINE HCL (CARDIAC) 20 MG/ML IV SOLN
INTRAVENOUS | Status: DC | PRN
Start: 2016-04-07 — End: 2016-04-07
  Administered 2016-04-07: 30 mg via INTRAVENOUS

## 2016-04-07 MED ORDER — ACETAMINOPHEN 10 MG/ML IV SOLN
INTRAVENOUS | Status: AC
  Filled 2016-04-07: qty 100

## 2016-04-07 MED ORDER — FAMOTIDINE 20 MG PO TABS
ORAL_TABLET | ORAL | Status: AC
Start: 2016-04-07 — End: 2016-04-07
  Administered 2016-04-07: 20 mg via ORAL
  Filled 2016-04-07: qty 1

## 2016-04-07 MED ORDER — PROPOFOL 10 MG/ML IV BOLUS
INTRAVENOUS | Status: AC
  Filled 2016-04-07: qty 20

## 2016-04-07 MED ORDER — BUPIVACAINE-EPINEPHRINE (PF) 0.5% -1:200000 IJ SOLN
INTRAMUSCULAR | Status: AC
  Filled 2016-04-07: qty 30

## 2016-04-07 MED ORDER — BUPIVACAINE-EPINEPHRINE (PF) 0.5% -1:200000 IJ SOLN
INTRAMUSCULAR | Status: DC | PRN
  Administered 2016-04-07: 30 mL

## 2016-04-07 MED ORDER — ONDANSETRON HCL 4 MG/2ML IJ SOLN
INTRAMUSCULAR | Status: AC
  Filled 2016-04-07: qty 2

## 2016-04-07 MED ORDER — MIDAZOLAM HCL 2 MG/2ML IJ SOLN
INTRAMUSCULAR | Status: DC | PRN
  Administered 2016-04-07: 2 mg via INTRAVENOUS

## 2016-04-07 MED ORDER — METHYLENE BLUE 0.5 % INJ SOLN
INTRAVENOUS | Status: DC | PRN
  Administered 2016-04-07: 1 mL

## 2016-04-07 MED ORDER — DEXAMETHASONE SODIUM PHOSPHATE 10 MG/ML IJ SOLN
INTRAMUSCULAR | Status: DC | PRN
  Administered 2016-04-07: 5 mg via INTRAVENOUS

## 2016-04-07 MED ORDER — PROMETHAZINE HCL 25 MG/ML IJ SOLN: 6.2500 mg | INTRAMUSCULAR | Status: DC | PRN

## 2016-04-07 MED ORDER — LACTATED RINGERS IV SOLN
INTRAVENOUS | Status: DC
  Administered 2016-04-07: 12:00:00 via INTRAVENOUS

## 2016-04-07 MED ORDER — DEXAMETHASONE SODIUM PHOSPHATE 10 MG/ML IJ SOLN
INTRAMUSCULAR | Status: AC
  Filled 2016-04-07: qty 1

## 2016-04-07 MED ORDER — ONDANSETRON HCL 4 MG/2ML IJ SOLN
INTRAMUSCULAR | Status: DC | PRN
  Administered 2016-04-07: 4 mg via INTRAVENOUS

## 2016-04-07 MED ORDER — PROPOFOL 10 MG/ML IV BOLUS
INTRAVENOUS | Status: DC | PRN
  Administered 2016-04-07: 200 mg via INTRAVENOUS

## 2016-04-07 MED ORDER — FENTANYL CITRATE (PF) 100 MCG/2ML IJ SOLN
INTRAMUSCULAR | Status: AC
  Filled 2016-04-07: qty 2

## 2016-04-07 MED ORDER — MEPERIDINE HCL 50 MG/ML IJ SOLN: 6.2500 mg | INTRAMUSCULAR | Status: DC | PRN

## 2016-04-07 MED ORDER — DEXTROSE 5 % IV SOLN: 2.0000 g | INTRAVENOUS | Status: DC

## 2016-04-07 SURGICAL SUPPLY — 34 items
BLADE SURG 15 STRL SS SAFETY (BLADE) ×3 IMPLANT
BRIEF STRETCH MATERNITY 2XLG (MISCELLANEOUS) ×3 IMPLANT
CANISTER SUCT 1200ML W/VALVE (MISCELLANEOUS) ×3 IMPLANT
DRAPE LAPAROTOMY 100X77 ABD (DRAPES) ×3 IMPLANT
DRAPE LEGGINS SURG 28X43 STRL (DRAPES) ×3 IMPLANT
DRAPE UNDER BUTTOCK W/FLU (DRAPES) ×3 IMPLANT
DRSG GAUZE PETRO 6X36 STRIP ST (GAUZE/BANDAGES/DRESSINGS) ×3 IMPLANT
ELECT REM PT RETURN 9FT ADLT (ELECTROSURGICAL) ×3
ELECTRODE REM PT RTRN 9FT ADLT (ELECTROSURGICAL) ×1 IMPLANT
GLOVE BIO SURGEON STRL SZ7.5 (GLOVE) ×5 IMPLANT
GLOVE INDICATOR 8.0 STRL GRN (GLOVE) ×5 IMPLANT
GOWN STRL REUS W/ TWL LRG LVL3 (GOWN DISPOSABLE) ×2 IMPLANT
GOWN STRL REUS W/TWL LRG LVL3 (GOWN DISPOSABLE) ×6
KIT RM TURNOVER STRD PROC AR (KITS) ×3 IMPLANT
LABEL OR SOLS (LABEL) ×3 IMPLANT
NDL FILTER BLUNT 18X1 1/2 (NEEDLE) IMPLANT
NDL HYPO 25X1 1.5 SAFETY (NEEDLE) ×1 IMPLANT
NDL SAFETY 22GX1.5 (NEEDLE) ×3 IMPLANT
NEEDLE FILTER BLUNT 18X 1/2SAF (NEEDLE) ×2
NEEDLE FILTER BLUNT 18X1 1/2 (NEEDLE) ×1 IMPLANT
NEEDLE HYPO 22GX1.5 SAFETY (NEEDLE) ×2 IMPLANT
NEEDLE HYPO 25X1 1.5 SAFETY (NEEDLE) ×3 IMPLANT
NS IRRIG 500ML POUR BTL (IV SOLUTION) ×3 IMPLANT
PACK BASIN MINOR ARMC (MISCELLANEOUS) ×3 IMPLANT
PAD OB MATERNITY 4.3X12.25 (PERSONAL CARE ITEMS) ×3 IMPLANT
PAD PREP 24X41 OB/GYN DISP (PERSONAL CARE ITEMS) ×3 IMPLANT
SOL PREP PVP 2OZ (MISCELLANEOUS) ×3
SOLUTION PREP PVP 2OZ (MISCELLANEOUS) ×1 IMPLANT
SURGILUBE 2OZ TUBE FLIPTOP (MISCELLANEOUS) ×3 IMPLANT
SUT SILK 0 CT 1 30 (SUTURE) ×3 IMPLANT
SUT VIC AB 3-0 SH 27 (SUTURE) ×3
SUT VIC AB 3-0 SH 27X BRD (SUTURE) ×1 IMPLANT
SYR CONTROL 10ML (SYRINGE) ×3 IMPLANT
SYRINGE 10CC LL (SYRINGE) ×2 IMPLANT

## 2016-04-07 NOTE — H&P (Signed)
No change in clinical condition or exam.  

## 2016-04-07 NOTE — OR Nursing (Signed)
[place] Dr. Bary Castilla Ok'd dc home.  Doing well no complaint of nausea or pain. Bleeding minimal

## 2016-04-07 NOTE — Anesthesia Preprocedure Evaluation (Signed)
Anesthesia Evaluation  Patient identified by MRN, date of birth, ID band Patient awake    Reviewed: Allergy & Precautions, NPO status , Patient's Chart, lab work & pertinent test results  History of Anesthesia Complications Negative for: history of anesthetic complications  Airway Mallampati: II  TM Distance: >3 FB Neck ROM: Full    Dental no notable dental hx.    Pulmonary neg sleep apnea, neg COPD, former smoker,    breath sounds clear to auscultation- rhonchi (-) wheezing      Cardiovascular Exercise Tolerance: Good (-) hypertension(-) angina(-) Past MI  Rhythm:Regular Rate:Normal - Systolic murmurs and - Diastolic murmurs    Neuro/Psych negative neurological ROS  negative psych ROS   GI/Hepatic Neg liver ROS, GERD  ,  Endo/Other  negative endocrine ROSneg diabetes  Renal/GU negative Renal ROS     Musculoskeletal negative musculoskeletal ROS (+)   Abdominal (+) + obese,   Peds  Hematology negative hematology ROS (+)   Anesthesia Other Findings Past Medical History: No date: Allergy No date: Chicken pox No date: GERD (gastroesophageal reflux disease)   Reproductive/Obstetrics                             Anesthesia Physical Anesthesia Plan  ASA: II  Anesthesia Plan: General   Post-op Pain Management:    Induction: Intravenous  Airway Management Planned: LMA  Additional Equipment:   Intra-op Plan:   Post-operative Plan:   Informed Consent: I have reviewed the patients History and Physical, chart, labs and discussed the procedure including the risks, benefits and alternatives for the proposed anesthesia with the patient or authorized representative who has indicated his/her understanding and acceptance.   Dental advisory given  Plan Discussed with: CRNA and Anesthesiologist  Anesthesia Plan Comments:         Anesthesia Quick Evaluation

## 2016-04-07 NOTE — Anesthesia Post-op Follow-up Note (Cosign Needed)
Anesthesia QCDR form completed.        

## 2016-04-07 NOTE — Discharge Instructions (Signed)
AMBULATORY SURGERY  °DISCHARGE INSTRUCTIONS ° ° °1) The drugs that you were given will stay in your system until tomorrow so for the next 24 hours you should not: ° °A) Drive an automobile °B) Make any legal decisions °C) Drink any alcoholic beverage ° ° °2) You may resume regular meals tomorrow.  Today it is better to start with liquids and gradually work up to solid foods. ° °You may eat anything you prefer, but it is better to start with liquids, then soup and crackers, and gradually work up to solid foods. ° ° °3) Please notify your doctor immediately if you have any unusual bleeding, trouble breathing, redness and pain at the surgery site, drainage, fever, or pain not relieved by medication. ° ° ° °4) Additional Instructions: ° ° ° ° ° ° ° °Please contact your physician with any problems or Same Day Surgery at [phone], Monday through Friday 6 am to 4 pm, or [place] [facility] number at [phone]. °

## 2016-04-07 NOTE — Anesthesia Postprocedure Evaluation (Signed)
Anesthesia Post Note  Patient: Marcus Clayton  Procedure(s) Performed: Procedure(s) (LRB): ANAL FISTULOTOMY (N/A)  Patient location during evaluation: PACU Anesthesia Type: General Level of consciousness: awake and alert Pain management: pain level controlled Vital Signs Assessment: post-procedure vital signs reviewed and stable Respiratory status: spontaneous breathing and respiratory function stable Cardiovascular status: stable Anesthetic complications: no     Last Vitals:  Vitals:   04/07/16 1143 04/07/16 1405  BP: 117/68 127/77  Pulse: 75 94  Resp: 16 18  Temp: 36.7 C 36.3 C    Last Pain:  Vitals:   04/07/16 1405  TempSrc:   PainSc: 0-No pain                 KEPHART,Reedy K

## 2016-04-07 NOTE — Transfer of Care (Signed)
Immediate Anesthesia Transfer of Care Note  Patient: Jerold Yoss Baumgardner  Procedure(s) Performed: Procedure(s): ANAL FISTULOTOMY (N/A)  Patient Location: PACU  Anesthesia Type:General  Level of Consciousness: awake, alert  and oriented  Airway & Oxygen Therapy: Patient Spontanous Breathing and Patient connected to face mask oxygen  Post-op Assessment: Report given to RN and Post -op Vital signs reviewed and stable  Post vital signs: Reviewed and stable  Last Vitals:  Vitals:   04/07/16 1143 04/07/16 1405  BP: 117/68 127/77  Pulse: 75 94  Resp: 16   Temp: 36.7 C     Last Pain:  Vitals:   04/07/16 1143  TempSrc: Tympanic         Complications: No apparent anesthesia complications

## 2016-04-07 NOTE — Anesthesia Procedure Notes (Signed)
Procedure Name: LMA Insertion Date/Time: 04/07/2016 1:12 PM Performed by: Darlyne Russian Pre-anesthesia Checklist: Patient identified, Emergency Drugs available, Suction available, Patient being monitored and Timeout performed Patient Re-evaluated:Patient Re-evaluated prior to inductionOxygen Delivery Method: Circle system utilized Preoxygenation: Pre-oxygenation with 100% oxygen Intubation Type: IV induction Ventilation: Mask ventilation without difficulty LMA: LMA inserted LMA Size: 5.0 Number of attempts: 1 Placement Confirmation: positive ETCO2 and breath sounds checked- equal and bilateral Dental Injury: Teeth and Oropharynx as per pre-operative assessment

## 2016-04-07 NOTE — Op Note (Signed)
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  Postoperative diagnosis: Same.  Operative procedure: Exam under anesthesia, drainage of perineal abscess.  Operating surgeon: Ollen Bowl, M.D.  Anesthesia: Gen. by LMA, Marcaine 0.5% with 1-200,000 epinephrine, 30 mL.  Estimated blood loss: Less than 30 mL.  Clinical note: This 35 over has had recurrent episodes of peritoneal inflammation and drainage. Clinical exam was consistent with a fistula. The opening was at the 2:00 position (dorsal lithotomy) see. The patient received preoperative antibiotics and is brought to the operative for planned fistulotomy.  Operative note: With the patient under adequate general anesthesia was placed in dorsal lithotomy position. The perineum was prepped with Betadine solution and draped. Local anesthetic was infiltrated for postoperative analgesia. These suspected fistula opening was cannulated with a lacrimal duct probe. This is swept superiorly and medially towards the midline but did not appear to enter the anorectal junction. The endoscope was passed and no clear internal opening was noted. 1 mL of methylene blue (half percent)  was diluted with 9 mL of saline and through a 20-gauge Angiocath was injected in the external opening. No internal draining site was identified. The skin and subcutaneous sphincter was divided with electrocautery over the lacrimal duct probe. The cavity was then scraped. Probing laterally to the opening showed no deep abscess. Hemostasis was achieved electrocautery. Dry gauze pad was applied and the patient was taken recovery room in stable condition.

## 2016-04-08 ENCOUNTER — Encounter: Payer: Self-pay | Admitting: General Surgery

## 2016-04-14 ENCOUNTER — Encounter: Payer: Self-pay | Admitting: General Surgery

## 2016-04-14 ENCOUNTER — Ambulatory Visit (INDEPENDENT_AMBULATORY_CARE_PROVIDER_SITE_OTHER): Payer: BLUE CROSS/BLUE SHIELD | Admitting: General Surgery

## 2016-04-14 VITALS — BP 128/78 | HR 97 | Resp 14 | Ht 72.0 in | Wt 233.0 lb

## 2016-04-14 DIAGNOSIS — K603 Anal fistula: Secondary | ICD-10-CM

## 2016-04-14 NOTE — Progress Notes (Signed)
Patient ID: Marcus Clayton, male   DOB: 12/01/1980, 36 y.o.   MRN: [id number]  Chief Complaint  Patient presents with  . Routine Post Op    HPI Marcus Clayton is a 36 y.o. male here today for his post op fistulotomy done on 04/07/2016. Patient states he is doing well. He reports that the bleeding has stopped but still has some clear drainage. Doing well no need for pain medications.  HPI  Past Medical History:  Diagnosis Date  . Allergy   . Chicken pox   . GERD (gastroesophageal reflux disease)     Past Surgical History:  Procedure Laterality Date  . ANAL FISTULOTOMY N/A 04/07/2016   Procedure: ANAL FISTULOTOMY;  Surgeon: Robert Bellow, MD;  Location: [facility] ORS;  Service: General;  Laterality: N/A;  . CYSTECTOMY  09/10/12.  Marland Kitchen lung collapsed  2000  . PILONIDAL CYST EXCISION  09/10/12    Family History  Problem Relation Age of Onset  . Diabetes Mother   . Diabetes Paternal Uncle   . Diabetes Paternal Grandmother   . Diabetes Paternal Uncle     Social History Social History  Substance Use Topics  . Smoking status: Former Smoker    Packs/day: 1.00    Years: 5.00    Types: Cigarettes    Quit date: 01/20/2014  . Smokeless tobacco: Never Used  . Alcohol use No    No Known Allergies  Current Outpatient Prescriptions  Medication Sig Dispense Refill  . acetaminophen (TYLENOL) 325 MG tablet Take 325 mg by mouth every 6 (six) hours as needed for headache.    . calcium carbonate (TUMS - DOSED IN MG ELEMENTAL CALCIUM) 500 MG chewable tablet Chew 2 tablets by mouth daily as needed for indigestion or heartburn.    . sodium chloride (OCEAN) 0.65 % SOLN nasal spray Place 1 spray into both nostrils as needed for congestion.     No current facility-administered medications for this visit.     Review of Systems Review of Systems  Constitutional: Negative.   Respiratory: Negative.   Cardiovascular: Negative.     Blood pressure 128/78, pulse 97, resp. rate 14,  height 6' (1.829 m), weight 233 lb (105.7 kg).  Physical Exam Physical Exam  Constitutional: He is oriented to person, place, and time. He appears well-developed and well-nourished.  Cardiovascular: Normal rate, regular rhythm and normal heart sounds.   Pulmonary/Chest: Effort normal and breath sounds normal.  Genitourinary:     Genitourinary Comments: Patient reports no difficulty with control of bowel function.  Neurological: He is alert and oriented to person, place, and time.  Skin: Skin is warm and dry.      Assessment    Anal fistula.    Plan    The internal opening was not identified, and for that reason we'll plan for follow-up exam in 3 weeks to be sure he doesn't develop recurrent symptoms. He was encouraged to call if he develops recurrent purulent drainage.   May return to work 04/18/16.     This information has been scribed by Gaspar Cola CMA.   Robert Bellow 04/14/2016, 1:51 PM

## 2016-05-06 ENCOUNTER — Ambulatory Visit (INDEPENDENT_AMBULATORY_CARE_PROVIDER_SITE_OTHER): Payer: BLUE CROSS/BLUE SHIELD | Admitting: General Surgery

## 2016-05-06 ENCOUNTER — Encounter: Payer: Self-pay | Admitting: General Surgery

## 2016-05-06 VITALS — BP 138/86 | HR 71 | Resp 12 | Ht 70.0 in | Wt 230.0 lb

## 2016-05-06 DIAGNOSIS — K603 Anal fistula: Secondary | ICD-10-CM

## 2016-05-06 NOTE — Patient Instructions (Signed)
Return as needed.The patient is aware to call back for any questions or concerns.  

## 2016-05-06 NOTE — Progress Notes (Signed)
Patient ID: Marcus Clayton, male   DOB: Jan 31, 1980, 36 y.o.   MRN: [id number]  Chief Complaint  Patient presents with  . Follow-up    fistulotomy     HPI Marcus Clayton is a 36 y.o. male here today for his follow up fistulotomy done on 04/07/2016. Patient states he is doing well. No pain or drainage. HPI  Past Medical History:  Diagnosis Date  . Allergy   . Chicken pox   . GERD (gastroesophageal reflux disease)     Past Surgical History:  Procedure Laterality Date  . ANAL FISTULOTOMY N/A 04/07/2016   Procedure: ANAL FISTULOTOMY;  Surgeon: Robert Bellow, MD;  Location: [facility] ORS;  Service: General;  Laterality: N/A;  . CYSTECTOMY  09/10/12.  Marland Kitchen lung collapsed  2000  . PILONIDAL CYST EXCISION  09/10/12    Family History  Problem Relation Age of Onset  . Diabetes Mother   . Diabetes Paternal Uncle   . Diabetes Paternal Grandmother   . Diabetes Paternal Uncle     Social History Social History  Substance Use Topics  . Smoking status: Former Smoker    Packs/day: 1.00    Years: 5.00    Types: Cigarettes    Quit date: 01/20/2014  . Smokeless tobacco: Never Used  . Alcohol use No    No Known Allergies  Current Outpatient Prescriptions  Medication Sig Dispense Refill  . acetaminophen (TYLENOL) 325 MG tablet Take 325 mg by mouth every 6 (six) hours as needed for headache.    . calcium carbonate (TUMS - DOSED IN MG ELEMENTAL CALCIUM) 500 MG chewable tablet Chew 2 tablets by mouth daily as needed for indigestion or heartburn.    . sodium chloride (OCEAN) 0.65 % SOLN nasal spray Place 1 spray into both nostrils as needed for congestion.     No current facility-administered medications for this visit.     Review of Systems Review of Systems  Constitutional: Negative.   Respiratory: Negative.   Cardiovascular: Negative.     Blood pressure 138/86, pulse 71, resp. rate 12, height 5\' 10"  (1.778 m), weight 230 lb (104.3 kg).  Physical Exam Physical Exam    Constitutional: He is oriented to person, place, and time. He appears well-developed and well-nourished.  Genitourinary:     Neurological: He is alert and oriented to person, place, and time.  Skin: Skin is warm.       Assessment    Doing well.  Small area of residual granulation tissue without evidence of persistent fistula.    Plan        Patient to return as needed. The patient is aware to call back for any questions or concerns.  The patient was reassured that this event was not related personal hygiene issues.  HPI, Physical Exam, Assessment and Plan have been scribed under the direction and in the presence of Hervey Ard, MD.   Gaspar Cola, CMA  I have completed the exam and reviewed the above documentation for accuracy and completeness.  I agree with the above.  Haematologist has been used and any errors in dictation or transcription are unintentional.  Hervey Ard, M.D., F.A.C.S.  Robert Bellow 05/06/2016, 11:46 AM

## 2016-09-01 ENCOUNTER — Ambulatory Visit (INDEPENDENT_AMBULATORY_CARE_PROVIDER_SITE_OTHER): Payer: BLUE CROSS/BLUE SHIELD | Admitting: General Surgery

## 2016-09-01 ENCOUNTER — Encounter: Payer: Self-pay | Admitting: General Surgery

## 2016-09-01 VITALS — BP 118/70 | HR 79 | Resp 14 | Ht 72.0 in | Wt 214.6 lb

## 2016-09-01 DIAGNOSIS — D1721 Benign lipomatous neoplasm of skin and subcutaneous tissue of right arm: Secondary | ICD-10-CM

## 2016-09-01 DIAGNOSIS — D1722 Benign lipomatous neoplasm of skin and subcutaneous tissue of left arm: Secondary | ICD-10-CM | POA: Diagnosis not present

## 2016-09-01 NOTE — Patient Instructions (Signed)
Lipoma Removal Lipoma removal is a surgical procedure to remove a noncancerous (benign) tumor that is made up of fat cells (lipoma). Most lipomas are small and painless and do not require treatment. They can form in many areas of the body but are most common under the skin of the back, shoulders, arms, and thighs. You may need lipoma removal if you have a lipoma that is large, growing, or causing discomfort. Lipoma removal may also be done for cosmetic reasons. Tell a health care provider about:  Any allergies you have.  All medicines you are taking, including vitamins, herbs, eye drops, creams, and over-the-counter medicines.  Any problems you or family members have had with anesthetic medicines.  Any blood disorders you have.  Any surgeries you have had.  Any medical conditions you have.  Whether you are pregnant or may be pregnant. What are the risks? Generally, this is a safe procedure. However, problems may occur, including:  Infection.  Bleeding.  Allergic reactions to medicines.  Damage to nerves or blood vessels near the lipoma.  Scarring.  What happens before the procedure? Staying hydrated Follow instructions from your health care provider about hydration, which may include:  Up to 2 hours before the procedure - you may continue to drink clear liquids, such as water, clear fruit juice, black coffee, and plain tea.  Eating and drinking restrictions Follow instructions from your health care provider about eating and drinking, which may include:  8 hours before the procedure - stop eating heavy meals or foods such as meat, fried foods, or fatty foods.  6 hours before the procedure - stop eating light meals or foods, such as toast or cereal.  6 hours before the procedure - stop drinking milk or drinks that contain milk.  2 hours before the procedure - stop drinking clear liquids.  Medicines  Ask your health care provider about: ? Changing or stopping your  regular medicines. This is especially important if you are taking diabetes medicines or blood thinners. ? Taking medicines such as aspirin and ibuprofen. These medicines can thin your blood. Do not take these medicines before your procedure if your health care provider instructs you not to.  You may be given antibiotic medicine to help prevent infection. General instructions  Ask your health care provider how your surgical site will be marked or identified.  You will have a physical exam. Your health care provider will check the size of the lipoma and whether it can be moved easily.  You may have imaging tests, such as: ? X-rays. ? CT scan. ? MRI.  Plan to have someone take you home from the hospital or clinic. What happens during the procedure?  To reduce your risk of infection: ? Your health care team will wash or sanitize their hands. ? Your skin will be washed with soap.  You will be given one or more of the following: ? A medicine to help you relax (sedative). ? A medicine to numb the area (local anesthetic). ? A medicine to make you fall asleep (general anesthetic). ? A medicine that is injected into an area of your body to numb everything below the injection site (regional anesthetic).  An incision will be made over the lipoma or very near the lipoma. The incision may be made in a natural skin line or crease.  Tissues, nerves, and blood vessels near the lipoma will be moved out of the way.  The lipoma and the capsule that surrounds it will be separated   from the surrounding tissues.  The lipoma will be removed.  The incision may be closed with stitches (sutures).  A bandage (dressing) will be placed over the incision. What happens after the procedure?  Do not drive for 24 hours if you received a sedative.  Your blood pressure, heart rate, breathing rate, and blood oxygen level will be monitored until the medicines you were given have worn off. This information is not  intended to replace advice given to you by your health care provider. Make sure you discuss any questions you have with your health care provider. Document Released: 03/22/2015 Document Revised: 06/14/2015 Document Reviewed: 03/22/2015 Elsevier Interactive Patient Education  2018 [employer]  

## 2016-09-01 NOTE — Progress Notes (Signed)
Patient ID: Marcus Clayton, male   DOB: Jul 28, 1980, 36 y.o.   MRN: [id number]  Chief Complaint  Patient presents with  . Other    lipoma on arms    HPI Marcus Clayton is a 36 y.o. male for evaluation of multiple lipomas on both of his arms. He reports that he has had these for many years. He reports that he started having some discomfort with the lipomas on his arms. This discomfort has stayed the same. He reports some enlargement of the lipomas since they started bothering him. He also has some on his legs but these are not bothering him.  These tend to get aggravated when he works out.  HPI  Past Medical History:  Diagnosis Date  . Allergy   . Chicken pox   . GERD (gastroesophageal reflux disease)   . Psoriasis     Past Surgical History:  Procedure Laterality Date  . ANAL FISTULOTOMY N/A 04/07/2016   Procedure: ANAL FISTULOTOMY;  Surgeon: Robert Bellow, MD;  Location: [facility] ORS;  Service: General;  Laterality: N/A;  . CYSTECTOMY  09/10/12.  Marland Kitchen lung collapsed  2000  . PILONIDAL CYST EXCISION  09/10/12    Family History  Problem Relation Age of Onset  . Diabetes Mother   . Diabetes Paternal Uncle   . Diabetes Paternal Grandmother   . Diabetes Paternal Uncle     Social History Social History  Substance Use Topics  . Smoking status: Former Smoker    Packs/day: 1.00    Years: 5.00    Types: Cigarettes    Quit date: 01/20/2014  . Smokeless tobacco: Never Used  . Alcohol use No    No Known Allergies  Current Outpatient Prescriptions  Medication Sig Dispense Refill  . acetaminophen (TYLENOL) 325 MG tablet Take 325 mg by mouth every 6 (six) hours as needed for headache.    . calcium carbonate (TUMS - DOSED IN MG ELEMENTAL CALCIUM) 500 MG chewable tablet Chew 2 tablets by mouth daily as needed for indigestion or heartburn.    . Multiple Vitamin (MULTIVITAMIN) tablet Take 1 tablet by mouth daily.    . tacrolimus (PROTOPIC) 0.1 % ointment      No current  facility-administered medications for this visit.     Review of Systems Review of Systems  Constitutional: Negative.   Respiratory: Negative.   Cardiovascular: Negative.   Genitourinary:       No further difficulty status post treatment for anal fistulotomy.    Blood pressure 118/70, pulse 79, resp. rate 14, height 6' (1.829 m), weight 214 lb 9.6 oz (97.3 kg).  Physical Exam Physical Exam  Constitutional: He is oriented to person, place, and time. He appears well-developed and well-nourished.  Eyes: Conjunctivae are normal. No scleral icterus.  Neck: Neck supple.  Cardiovascular: Normal rate, regular rhythm and normal heart sounds.   Pulmonary/Chest: Effort normal and breath sounds normal.  Musculoskeletal:       Arms: Lymphadenopathy:    He has no cervical adenopathy.  Neurological: He is alert and oriented to person, place, and time.  Skin: Skin is warm and dry.  7 lipomas on anterior right arm, 3 on posterior right arm. 12 lipomas on left arm.   Psychiatric: He has a normal mood and affect.      Assessment    Numerous, enlarging lipomas of both forearms, encroaching on joint spaces distally.    Plan    With the multitude of lesions I think this would be difficult  to do as an office procedure. The patient has been encouraged to place a magic marker.on any lesion particularly symptomatic the morning of surgery. This is to be sure that a small lesion that is symptomatic not overlooked.  He was advised that he'll likely have an intravenous in his lower extremity as both forearms will be exposed for excision.  Anticipate he'll be out of work about a week, perhaps less pain son his pain tolerance. 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    HPI, Physical Exam, Assessment and Plan have been scribed under the direction and in the presence of Robert Bellow, MD  Concepcion Living, LPN  I have completed  the exam and reviewed the above documentation for accuracy and completeness.  I agree with the above.  Haematologist has been used and any errors in dictation or transcription are unintentional.  Hervey Ard, M.D., F.A.C.S.   Robert Bellow 09/02/2016, 7:09 AM  Patient's surgery has been scheduled for 09-29-16 at [facility].   Dominga Ferry, CMA

## 2016-09-02 DIAGNOSIS — D1721 Benign lipomatous neoplasm of skin and subcutaneous tissue of right arm: Secondary | ICD-10-CM | POA: Insufficient documentation

## 2016-09-02 DIAGNOSIS — D1722 Benign lipomatous neoplasm of skin and subcutaneous tissue of left arm: Secondary | ICD-10-CM | POA: Insufficient documentation

## 2016-09-23 ENCOUNTER — Encounter
Admission: RE | Admit: 2016-09-23 | Discharge: 2016-09-23 | Disposition: A | Discharge status: Home / Self Care | Payer: BLUE CROSS/BLUE SHIELD | Source: Ambulatory Visit | Attending: General Surgery | Admitting: General Surgery

## 2016-09-23 NOTE — Patient Instructions (Signed)
  Your procedure is scheduled on: 09/29/16 Report to Day Surgery. [facility]OND FLOOR To find out your arrival time please call [phone] between 1PM - 3PM on 09/26/16  Remember: Instructions that are not followed completely may result in serious medical risk, up to and including death, or upon the discretion of your surgeon and anesthesiologist your surgery may need to be rescheduled.    _x___ 1. Do not eat food after midnight the night before your procedure. No gum chewing or hard candies. You may drink clear liquids up to 2 hours before you are scheduled to arrive for your surgery- DO not drink clear liquids within 2 hours of the start of your surgery.  Clear Liquids include: water, apple juice without pulp, clear carbohydrate drink such as Clearfast of Gartorade, Black Coffee or Tea (Do not add anything to coffee or tea).    __x__ 2. No Alcohol for 24 hours before or after surgery.   __x__ 3. Do Not Smoke For 24 Hours Prior to Your Surgery.   ____ 4. Bring all medications with you on the day of surgery if instructed.    __x__ 5. Notify your doctor if there is any change in your medical condition     (cold, fever, infections).       Do not wear jewelry, make-up, hairpins, clips or nail polish.  Do not wear lotions, powders, or perfumes. You may wear deodorant.  Do not shave 48 hours prior to surgery. Men may shave face and neck.  Do not bring valuables to the hospital.    [facility] is not responsible for any belongings or valuables.               Contacts, dentures or bridgework may not be worn into surgery.  Leave your suitcase in the car. After surgery it may be brought to your room.  For patients admitted to the hospital, discharge time is determined by your                treatment team.   Patients discharged the day of surgery will not be allowed to drive home.      ____ Take these medicines the morning of surgery with A SIP OF WATER:     1.none  2.   3.   4.  5.  6.  ____ Fleet Enema (as directed)   ____ Use CHG Soap as directed  ____ Use inhalers on the day of surgery  ____ Stop metformin 2 days prior to surgery    ____ Take 1/2 of usual insulin dose the night before surgery and none on the morning of surgery.   ____ Stop Coumadin/Plavix/aspirin on  ____ Stop Anti-inflammatories on    ____ Stop supplements until after surgery.    ____ Bring C-Pap to the hospital.

## 2016-09-29 ENCOUNTER — Encounter
Admission: RE | Disposition: A | Discharge status: Home / Self Care | Payer: Self-pay | Source: Ambulatory Visit | Attending: General Surgery

## 2016-09-29 ENCOUNTER — Ambulatory Visit: Payer: BLUE CROSS/BLUE SHIELD | Admitting: Anesthesiology

## 2016-09-29 ENCOUNTER — Ambulatory Visit
Admission: RE | Admit: 2016-09-29 | Discharge: 2016-09-29 | Disposition: A | Discharge status: Home / Self Care | Payer: BLUE CROSS/BLUE SHIELD | Source: Ambulatory Visit | Attending: General Surgery | Admitting: General Surgery

## 2016-09-29 ENCOUNTER — Encounter: Payer: Self-pay | Admitting: *Deleted

## 2016-09-29 DIAGNOSIS — D1722 Benign lipomatous neoplasm of skin and subcutaneous tissue of left arm: Secondary | ICD-10-CM | POA: Diagnosis not present

## 2016-09-29 DIAGNOSIS — Z87891 Personal history of nicotine dependence: Secondary | ICD-10-CM | POA: Diagnosis not present

## 2016-09-29 DIAGNOSIS — D1721 Benign lipomatous neoplasm of skin and subcutaneous tissue of right arm: Secondary | ICD-10-CM | POA: Diagnosis not present

## 2016-09-29 HISTORY — PX: LIPOMA EXCISION: SHX5283

## 2016-09-29 SURGERY — EXCISION LIPOMA
Anesthesia: General | Laterality: Bilateral | Wound class: Clean

## 2016-09-29 MED ORDER — ONDANSETRON HCL 4 MG/2ML IJ SOLN
INTRAMUSCULAR | Status: DC | PRN
  Administered 2016-09-29: 4 mg via INTRAVENOUS

## 2016-09-29 MED ORDER — KETOROLAC TROMETHAMINE 30 MG/ML IJ SOLN
INTRAMUSCULAR | Status: DC | PRN
  Administered 2016-09-29: 30 mg via INTRAVENOUS

## 2016-09-29 MED ORDER — GABAPENTIN 300 MG PO CAPS
ORAL_CAPSULE | ORAL | Status: AC
  Administered 2016-09-29: 300 mg via ORAL
  Filled 2016-09-29: qty 1

## 2016-09-29 MED ORDER — ACETAMINOPHEN 500 MG PO TABS
1000.0000 mg | ORAL_TABLET | ORAL | Status: AC
  Administered 2016-09-29: 1000 mg via ORAL

## 2016-09-29 MED ORDER — PROPOFOL 10 MG/ML IV BOLUS
INTRAVENOUS | Status: AC
  Filled 2016-09-29: qty 20

## 2016-09-29 MED ORDER — LIDOCAINE HCL (PF) 2 % IJ SOLN
INTRAMUSCULAR | Status: AC
  Filled 2016-09-29: qty 2

## 2016-09-29 MED ORDER — KETOROLAC TROMETHAMINE 30 MG/ML IJ SOLN
INTRAMUSCULAR | Status: AC
  Filled 2016-09-29: qty 1

## 2016-09-29 MED ORDER — ACETAMINOPHEN 500 MG PO TABS
ORAL_TABLET | ORAL | Status: AC
  Administered 2016-09-29: 1000 mg via ORAL
  Filled 2016-09-29: qty 2

## 2016-09-29 MED ORDER — ACETAMINOPHEN 10 MG/ML IV SOLN
INTRAVENOUS | Status: AC
  Filled 2016-09-29: qty 100

## 2016-09-29 MED ORDER — GABAPENTIN 300 MG PO CAPS
300.0000 mg | ORAL_CAPSULE | ORAL | Status: AC
  Administered 2016-09-29: 300 mg via ORAL

## 2016-09-29 MED ORDER — CELECOXIB 200 MG PO CAPS
ORAL_CAPSULE | ORAL | Status: AC
  Administered 2016-09-29: 400 mg via ORAL
  Filled 2016-09-29: qty 2

## 2016-09-29 MED ORDER — ONDANSETRON HCL 4 MG/2ML IJ SOLN: 4.0000 mg | Freq: Once | INTRAMUSCULAR | Status: DC | PRN

## 2016-09-29 MED ORDER — LIDOCAINE HCL (PF) 2 % IJ SOLN
INTRAMUSCULAR | Status: DC | PRN
  Administered 2016-09-29: 40 mg

## 2016-09-29 MED ORDER — FENTANYL CITRATE (PF) 100 MCG/2ML IJ SOLN
INTRAMUSCULAR | Status: AC
Start: 2016-09-29 — End: ?
  Filled 2016-09-29: qty 2

## 2016-09-29 MED ORDER — DEXAMETHASONE SODIUM PHOSPHATE 10 MG/ML IJ SOLN
INTRAMUSCULAR | Status: DC | PRN
  Administered 2016-09-29: 5 mg via INTRAVENOUS

## 2016-09-29 MED ORDER — ACETAMINOPHEN 10 MG/ML IV SOLN
INTRAVENOUS | Status: DC | PRN
  Administered 2016-09-29: 1000 mg via INTRAVENOUS

## 2016-09-29 MED ORDER — FENTANYL CITRATE (PF) 100 MCG/2ML IJ SOLN: 25.0000 ug | INTRAMUSCULAR | Status: DC | PRN

## 2016-09-29 MED ORDER — DEXAMETHASONE SODIUM PHOSPHATE 10 MG/ML IJ SOLN
INTRAMUSCULAR | Status: AC
  Filled 2016-09-29: qty 1

## 2016-09-29 MED ORDER — CELECOXIB 200 MG PO CAPS
400.0000 mg | ORAL_CAPSULE | ORAL | Status: AC
  Administered 2016-09-29: 400 mg via ORAL

## 2016-09-29 MED ORDER — PHENYLEPHRINE HCL 10 MG/ML IJ SOLN
INTRAMUSCULAR | Status: AC
  Filled 2016-09-29: qty 1

## 2016-09-29 MED ORDER — PROPOFOL 10 MG/ML IV BOLUS
INTRAVENOUS | Status: DC | PRN
  Administered 2016-09-29: 200 mg via INTRAVENOUS

## 2016-09-29 MED ORDER — GLYCOPYRROLATE 0.2 MG/ML IJ SOLN
INTRAMUSCULAR | Status: DC | PRN
  Administered 2016-09-29: 0.2 mg via INTRAVENOUS

## 2016-09-29 MED ORDER — ONDANSETRON HCL 4 MG/2ML IJ SOLN
INTRAMUSCULAR | Status: AC
  Filled 2016-09-29: qty 2

## 2016-09-29 MED ORDER — LIDOCAINE-EPINEPHRINE 1 %-1:100000 IJ SOLN
INTRAMUSCULAR | Status: DC | PRN
  Administered 2016-09-29: 11.5 mL
  Administered 2016-09-29: 8.5 mL

## 2016-09-29 MED ORDER — LIDOCAINE-EPINEPHRINE 1 %-1:100000 IJ SOLN
INTRAMUSCULAR | Status: AC
  Filled 2016-09-29: qty 1

## 2016-09-29 MED ORDER — BUPIVACAINE HCL (PF) 0.5 % IJ SOLN
INTRAMUSCULAR | Status: AC
  Filled 2016-09-29: qty 30

## 2016-09-29 MED ORDER — GLYCOPYRROLATE 0.2 MG/ML IJ SOLN
INTRAMUSCULAR | Status: AC
  Filled 2016-09-29: qty 1

## 2016-09-29 MED ORDER — FAMOTIDINE 20 MG PO TABS
ORAL_TABLET | ORAL | Status: AC
  Administered 2016-09-29: 20 mg via ORAL
  Filled 2016-09-29: qty 1

## 2016-09-29 MED ORDER — LACTATED RINGERS IV SOLN
INTRAVENOUS | Status: DC
  Administered 2016-09-29: 07:00:00 via INTRAVENOUS

## 2016-09-29 MED ORDER — HYDROCODONE-ACETAMINOPHEN 5-325 MG PO TABS: 1.0000 | ORAL_TABLET | ORAL | 0 refills | Status: DC | PRN

## 2016-09-29 MED ORDER — FENTANYL CITRATE (PF) 100 MCG/2ML IJ SOLN
INTRAMUSCULAR | Status: DC | PRN
  Administered 2016-09-29 (×4): 50 ug via INTRAVENOUS

## 2016-09-29 MED ORDER — FAMOTIDINE 20 MG PO TABS
20.0000 mg | ORAL_TABLET | Freq: Once | ORAL | Status: AC
  Administered 2016-09-29: 20 mg via ORAL

## 2016-09-29 MED ORDER — BUPIVACAINE HCL 0.5 % IJ SOLN
INTRAMUSCULAR | Status: DC | PRN
  Administered 2016-09-29: 8.5 mL
  Administered 2016-09-29: 11.5 mL

## 2016-09-29 SURGICAL SUPPLY — 38 items
BANDAGE ELASTIC 4 LF NS (GAUZE/BANDAGES/DRESSINGS) ×3 IMPLANT
BANDAGE ELASTIC 6 LF NS (GAUZE/BANDAGES/DRESSINGS) ×1 IMPLANT
BNDG CMPR MED 5X4 ELC HKLP NS (GAUZE/BANDAGES/DRESSINGS) ×1
BNDG CMPR MED 5X6 ELC HKLP NS (GAUZE/BANDAGES/DRESSINGS)
BNDG GAUZE 4.5X4.1 6PLY STRL (MISCELLANEOUS) ×3 IMPLANT
CANISTER SUCT 1200ML W/VALVE (MISCELLANEOUS) ×3 IMPLANT
CHLORAPREP W/TINT 26ML (MISCELLANEOUS) ×5 IMPLANT
CLOSURE WOUND 1/2 X4 (GAUZE/BANDAGES/DRESSINGS) ×4
DRAPE LAPAROTOMY 100X77 ABD (DRAPES) ×1 IMPLANT
DRAPE LAPAROTOMY 77X122 PED (DRAPES) ×4 IMPLANT
DRAPE SHEET LG 3/4 BI-LAMINATE (DRAPES) ×7 IMPLANT
DRSG TEGADERM 4X4.75 (GAUZE/BANDAGES/DRESSINGS) ×1 IMPLANT
DRSG TELFA 4X3 1S NADH ST (GAUZE/BANDAGES/DRESSINGS) ×1 IMPLANT
ELECT REM PT RETURN 9FT ADLT (ELECTROSURGICAL) ×3
ELECTRODE REM PT RTRN 9FT ADLT (ELECTROSURGICAL) ×1 IMPLANT
GAUZE SPONGE 4X4 12PLY STRL (GAUZE/BANDAGES/DRESSINGS) ×1 IMPLANT
GLOVE BIO SURGEON STRL SZ7.5 (GLOVE) ×17 IMPLANT
GLOVE INDICATOR 8.0 STRL GRN (GLOVE) ×3 IMPLANT
GOWN STRL REUS W/ TWL LRG LVL3 (GOWN DISPOSABLE) ×1 IMPLANT
GOWN STRL REUS W/ TWL XL LVL3 (GOWN DISPOSABLE) ×1 IMPLANT
GOWN STRL REUS W/TWL LRG LVL3 (GOWN DISPOSABLE) ×12
GOWN STRL REUS W/TWL XL LVL3 (GOWN DISPOSABLE)
KIT RM TURNOVER STRD PROC AR (KITS) ×3 IMPLANT
LABEL OR SOLS (LABEL) ×3 IMPLANT
NS IRRIG 500ML POUR BTL (IV SOLUTION) ×3 IMPLANT
PACK BASIN MINOR ARMC (MISCELLANEOUS) ×3 IMPLANT
PAD PREP 24X41 OB/GYN DISP (PERSONAL CARE ITEMS) ×1 IMPLANT
STOCKINETTE STRL 6IN 960660 (GAUZE/BANDAGES/DRESSINGS) ×5 IMPLANT
STRIP CLOSURE SKIN 1/2X4 (GAUZE/BANDAGES/DRESSINGS) ×5 IMPLANT
SUT ETHILON 4-0 (SUTURE) ×3
SUT ETHILON 4-0 FS2 18XMFL BLK (SUTURE) ×1
SUT VIC AB 2-0 CT1 (SUTURE) ×3 IMPLANT
SUT VIC AB 3-0 SH 27 (SUTURE) ×3
SUT VIC AB 3-0 SH 27X BRD (SUTURE) ×1 IMPLANT
SUT VIC AB 4-0 FS2 27 (SUTURE) ×13 IMPLANT
SUT VICRYL+ 3-0 144IN (SUTURE) ×3 IMPLANT
SUTURE ETHLN 4-0 FS2 18XMF BLK (SUTURE) ×1 IMPLANT
SWABSTK COMLB BENZOIN TINCTURE (MISCELLANEOUS) ×1 IMPLANT

## 2016-09-29 NOTE — Anesthesia Preprocedure Evaluation (Signed)
Anesthesia Evaluation  Patient identified by MRN, date of birth, ID band Patient awake    Reviewed: Allergy & Precautions, NPO status , Patient's Chart, lab work & pertinent test results  History of Anesthesia Complications Negative for: history of anesthetic complications  Airway Mallampati: II       Dental   Pulmonary neg sleep apnea, neg COPD, former smoker,           Cardiovascular (-) hypertension(-) Past MI and (-) CHF (-) dysrhythmias (-) Valvular Problems/Murmurs     Neuro/Psych neg Seizures    GI/Hepatic Neg liver ROS, GERD  ,  Endo/Other  neg diabetes  Renal/GU negative Renal ROS     Musculoskeletal   Abdominal   Peds  Hematology   Anesthesia Other Findings   Reproductive/Obstetrics                             Anesthesia Physical Anesthesia Plan  ASA: II  Anesthesia Plan: General   Post-op Pain Management:    Induction: Intravenous  PONV Risk Score and Plan:   Airway Management Planned: LMA  Additional Equipment:   Intra-op Plan:   Post-operative Plan:   Informed Consent: I have reviewed the patients History and Physical, chart, labs and discussed the procedure including the risks, benefits and alternatives for the proposed anesthesia with the patient or authorized representative who has indicated his/her understanding and acceptance.     Plan Discussed with:   Anesthesia Plan Comments:         Anesthesia Quick Evaluation

## 2016-09-29 NOTE — H&P (Signed)
No change in clinical history or exam. Multiple upper extremity lipomas for excision.

## 2016-09-29 NOTE — OR Nursing (Signed)
Dr. Bary Castilla in to see pt @ 10:50 am

## 2016-09-29 NOTE — Anesthesia Post-op Follow-up Note (Signed)
Anesthesia QCDR form completed.        

## 2016-09-29 NOTE — Anesthesia Procedure Notes (Signed)
Procedure Name: LMA Insertion Performed by: Verlinda Slotnick Pre-anesthesia Checklist: Patient identified, Patient being monitored, Timeout performed, Emergency Drugs available and Suction available Patient Re-evaluated:Patient Re-evaluated prior to induction Oxygen Delivery Method: Circle system utilized Preoxygenation: Pre-oxygenation with 100% oxygen Induction Type: IV induction Ventilation: Mask ventilation without difficulty LMA: LMA inserted LMA Size: 4.5 Tube type: Oral Number of attempts: 1 Placement Confirmation: positive ETCO2 and breath sounds checked- equal and bilateral Tube secured with: Tape Dental Injury: Teeth and Oropharynx as per pre-operative assessment        

## 2016-09-29 NOTE — Anesthesia Postprocedure Evaluation (Signed)
Anesthesia Post Note  Patient: Marcus Clayton  Procedure(s) Performed: Procedure(s) (LRB): EXCISION LIPOMA-BILATERAL ARM (Bilateral)  Patient location during evaluation: PACU Anesthesia Type: General Level of consciousness: awake and alert Pain management: pain level controlled Vital Signs Assessment: post-procedure vital signs reviewed and stable Respiratory status: spontaneous breathing and respiratory function stable Cardiovascular status: stable Anesthetic complications: no     Last Vitals:  Vitals:   09/29/16 0620 09/29/16 0922  BP: (!) (P) 110/58 (!) 110/58  Pulse: (P) 83 85  Resp: (P) 18 15  Temp: (!) (P) 36.2 C (!) 36.2 C  SpO2: (P) 98% 97%    Last Pain: There were no vitals filed for this visit.               Gearold Wainer,Tiras K

## 2016-09-29 NOTE — Transfer of Care (Signed)
Immediate Anesthesia Transfer of Care Note  Patient: Marcus Clayton  Procedure(s) Performed: Procedure(s): EXCISION LIPOMA-BILATERAL ARM (Bilateral)  Patient Location: PACU  Anesthesia Type:General  Level of Consciousness: sedated  Airway & Oxygen Therapy: Patient Spontanous Breathing and Patient connected to face mask oxygen  Post-op Assessment: Report given to RN and Post -op Vital signs reviewed and stable  Post vital signs: Reviewed  Last Vitals:  Vitals:   09/29/16 0615 09/29/16 0922  BP: 128/75 (!) 110/58  Pulse: 78 85  Resp: 17 15  Temp: (!) 36.2 C (!) 36.2 C  SpO2: 100% 97%    Last Pain: There were no vitals filed for this visit.       Complications: No apparent anesthesia complications

## 2016-09-29 NOTE — Discharge Instructions (Signed)
AMBULATORY SURGERY  DISCHARGE INSTRUCTIONS   1) The drugs that you were given will stay in your system until tomorrow so for the next 24 hours you should not:  A) Drive an automobile B) Make any legal decisions C) Drink any alcoholic beverage   2) You may resume regular meals tomorrow.  Today it is better to start with liquids and gradually work up to solid foods.  You may eat anything you prefer, but it is better to start with liquids, then soup and crackers, and gradually work up to solid foods.   3) Please notify your doctor immediately if you have any unusual bleeding, trouble breathing, redness and pain at the surgery site, drainage, fever, or pain not relieved by medication.    4) Additional Instructions: Follow up with Dr. Bary Castilla 10/02/16 at 10:00 AM       Please contact your physician with any problems or Same Day Surgery at [phone], Monday through Friday 6 am to 4 pm, or [place] at [facility] number at [phone].

## 2016-09-29 NOTE — Op Note (Signed)
Preoperative diagnosis: Multiple lipomas of the upper extremity.  Postoperative diagnosis: Same.  Operative procedure: Excision of multiple upper extremity lipomas.  Operating surgeon: Ollen Bowl, M.D.  Anesthesia: Gen. by LMA, Xylocaine 1:200,000 units of epinephrine, 20 mL; Marcaine 0.5%, plain 30 mL, 40 mL total volume.  Estimated blood loss: 10 mL.  Clinical note: This 36 year old male spelled multiple enlarging lipomas in the upper extremities. He was made for excision.  Preoperative examination showed 10 dominant areas extending from the lower aspect of the upper arm to the wrist area on the left side there were 7 dominant areas with evidence of multiple lipomas at several sites.  Operative note: With the patient under adequate general anesthesia the right hand and arm was prepped to the axilla. A stockinette was applied followed by sterile drapes. The area of the lipomas were infiltrated with local anesthetic. Through 11 incisions the lesions were excised with hemostasis achieved by electrocautery. The lesions all had a similar clinical appearance and on preprocedure discussion with pathology they were sent in toto as lipomas of the right upper extremity. Skin incisions were closed with 4-0 Vicryl subcuticular sutures. The longer incisions were closed with a running subcuticular suture. Benzoin, Steri-Strips were applied. Fluff gauze and a light Ace wrap was placed from the wrist to the lower aspect of the upper arm.  Attention was turned to the left upper extremity. A similar prep was undertaken including the hand up to the upper arm. Draped and local anesthetic infiltrated on the lipomas sites. On the left side these were in general larger than the right and clusters of adjacent lipomas. These were again excised through either longitudinal transverse incision is appropriate. Hemostasis and several sites included 3-0 Vicryl ties. All of the lipomas appeared similar and as noted above  were sent in toto as left upper extremity lipomas. Skin incisions were closed with a 4-0 Vicryl suture both as running and interrupted simple sutures. The wounds were dressed as noted above. Kerlix and Ace wrap applied from wrist elbow.  The patient tolerated the procedure well. Assessment time the procedure was approximately 1 hour 30 minutes.

## 2016-09-30 ENCOUNTER — Encounter: Payer: Self-pay | Admitting: General Surgery

## 2016-09-30 LAB — SURGICAL PATHOLOGY

## 2016-10-01 ENCOUNTER — Telehealth: Payer: Self-pay

## 2016-10-01 NOTE — Telephone Encounter (Signed)
Notified patient as instructed, patient pleased. Discussed follow-up appointments, patient agrees  

## 2016-10-01 NOTE — Telephone Encounter (Signed)
-----   Message from Robert Bellow, MD sent at 09/30/2016  5:17 PM EDT ----- Please let the patient know that all of this little fatty tumors were benign. Thank you ----- Message ----- From: Interface, Lab In Three Zero One Sent: 09/30/2016   9:43 AM To: Robert Bellow, MD

## 2016-10-02 ENCOUNTER — Ambulatory Visit (INDEPENDENT_AMBULATORY_CARE_PROVIDER_SITE_OTHER): Payer: BLUE CROSS/BLUE SHIELD | Admitting: General Surgery

## 2016-10-02 ENCOUNTER — Encounter: Payer: Self-pay | Admitting: General Surgery

## 2016-10-02 VITALS — BP 118/74 | HR 72 | Resp 14 | Ht 72.0 in | Wt 215.0 lb

## 2016-10-02 DIAGNOSIS — D1722 Benign lipomatous neoplasm of skin and subcutaneous tissue of left arm: Secondary | ICD-10-CM

## 2016-10-02 DIAGNOSIS — D1721 Benign lipomatous neoplasm of skin and subcutaneous tissue of right arm: Secondary | ICD-10-CM

## 2016-10-02 NOTE — Patient Instructions (Signed)
You may shower. The steri strips will fall off on their own in a couple of weeks.  Follow up as needed.

## 2016-10-02 NOTE — Progress Notes (Signed)
Patient ID: Marcus Clayton, male   DOB: 1980-02-21, 36 y.o.   MRN: [id number]  Chief Complaint  Patient presents with  . Routine Post Op    HPI Marcus Clayton is a 36 y.o. male here for a post op for excision of multiple lipomas from his forearms. He reports that he is very well. He has only used Ibuprofen for pain control.  HPI  Past Medical History:  Diagnosis Date  . Allergy   . Chicken pox   . GERD (gastroesophageal reflux disease)   . Psoriasis     Past Surgical History:  Procedure Laterality Date  . ANAL FISTULOTOMY N/A 04/07/2016   Procedure: ANAL FISTULOTOMY;  Surgeon: Robert Bellow, MD;  Location: [facility] ORS;  Service: General;  Laterality: N/A;  . CYSTECTOMY  09/10/12.  Marland Kitchen LIPOMA EXCISION Bilateral 09/29/2016   Procedure: EXCISION LIPOMA-BILATERAL ARM;  Surgeon: Robert Bellow, MD;  Location: [facility] ORS;  Service: General;  Laterality: Bilateral;  . lung collapsed  2000  . PILONIDAL CYST EXCISION  09/10/12    Family History  Problem Relation Age of Onset  . Diabetes Mother   . Diabetes Paternal Uncle   . Diabetes Paternal Grandmother   . Diabetes Paternal Uncle     Social History Social History  Substance Use Topics  . Smoking status: Former Smoker    Packs/day: 1.00    Years: 5.00    Types: Cigarettes    Quit date: 01/20/2014  . Smokeless tobacco: Never Used  . Alcohol use No    No Known Allergies  Current Outpatient Prescriptions  Medication Sig Dispense Refill  . calcium carbonate (TUMS - DOSED IN MG ELEMENTAL CALCIUM) 500 MG chewable tablet Chew 2 tablets by mouth daily as needed for indigestion or heartburn.    . fluticasone (FLONASE) 50 MCG/ACT nasal spray Place 1 spray into both nostrils daily as needed for allergies or rhinitis.    Marland Kitchen ibuprofen (ADVIL,MOTRIN) 200 MG tablet Take 200 mg by mouth every 6 (six) hours as needed.    . Multiple Vitamin (MULTIVITAMIN) tablet Take 1 tablet by mouth daily.     No current  facility-administered medications for this visit.     Review of Systems Review of Systems  Constitutional: Negative.   Respiratory: Negative.   Cardiovascular: Negative.     Blood pressure 118/74, pulse 72, resp. rate 14, height 6' (1.829 m), weight 215 lb (97.5 kg).  Physical Exam Physical Exam  Constitutional: He is oriented to person, place, and time. He appears well-developed and well-nourished.  Neurological: He is alert and oriented to person, place, and time.  Skin: Skin is warm and dry.  Multiple excision sites healing well.   Psychiatric: He has a normal mood and affect.    Data Reviewed DIAGNOSIS:  A. LIPOMAS, RIGHT ARM; EXCISION:  - ANGIOLIPOMA, MULTIPLE.   B. LIPOMAS, LEFT ARM; EXCISION:  - ANGIOLIPOMA, MULTIPLE.   Assessment    Doing well status post excision of multiple lipomas from the upper extremity.    Plan    Released to full activity.  Follow-up as needed.    HPI, Physical Exam, Assessment and Plan have been scribed under the direction and in the presence of Robert Bellow, MD  Concepcion Living, LPN  I have completed the exam and reviewed the above documentation for accuracy and completeness.  I agree with the above.  Haematologist has been used and any errors in dictation or transcription are unintentional.  Hervey Ard, M.D., F.A.C.S.  Robert Bellow 10/03/2016, 4:09 PM

## 2017-03-12 ENCOUNTER — Encounter: Payer: Self-pay | Admitting: Family Medicine

## 2017-03-12 ENCOUNTER — Ambulatory Visit (INDEPENDENT_AMBULATORY_CARE_PROVIDER_SITE_OTHER): Payer: BLUE CROSS/BLUE SHIELD | Admitting: Family Medicine

## 2017-03-12 ENCOUNTER — Other Ambulatory Visit: Payer: Self-pay

## 2017-03-12 DIAGNOSIS — J309 Allergic rhinitis, unspecified: Secondary | ICD-10-CM | POA: Diagnosis not present

## 2017-03-12 DIAGNOSIS — E669 Obesity, unspecified: Secondary | ICD-10-CM | POA: Insufficient documentation

## 2017-03-12 DIAGNOSIS — L731 Pseudofolliculitis barbae: Secondary | ICD-10-CM | POA: Insufficient documentation

## 2017-03-12 DIAGNOSIS — E663 Overweight: Secondary | ICD-10-CM | POA: Diagnosis not present

## 2017-03-12 DIAGNOSIS — D179 Benign lipomatous neoplasm, unspecified: Secondary | ICD-10-CM

## 2017-03-12 NOTE — Patient Instructions (Signed)
Nice to meet you. Please watch the area in your beard.  I believe this was an ingrown hair that has resolved.  If it returns or develops new symptoms please let us know. You can use Flonase and Allegra or Claritin for your upper respiratory symptoms. Please continue to work on diet and exercise.

## 2017-03-12 NOTE — Progress Notes (Signed)
  Tommi Rumps, MD Phone: [phone]  Marcus Clayton is a 37 y.o. male who presents today for follow-up.  Patient notes recently having  Small area of swelling within his beard underneath his right jawline.  It got enlarged and then decreased in size and has remained small.  Did not drain anything.  He notes allergy symptoms.  He has postnasal drip and sneezing.  Some nasal congestion.  This has been going on with the weather change.  He was on Allegra-D though he stopped that.  Using Flonase with some benefit.  He has had lipomas removed by general surgery.  He notes they only removed some of them.  He does not want any more removed at this time.  Has been working on dietary changes and has been doing intermittent fasting.  He cut sodas out has been back at the gym as well.  He reports he has lost 30-40 pounds with trying.  Social History   Tobacco Use  Smoking Status Former Smoker  . Packs/day: 1.00  . Years: 5.00  . Pack years: 5.00  . Types: Cigarettes  . Last attempt to quit: 01/20/2014  . Years since quitting: 3.1  Smokeless Tobacco Never Used     ROS see history of present illness  Objective  Physical Exam Vitals:   03/12/17 0824  BP: 120/84  Pulse: 72  Temp: 98.5 F (36.9 C)  SpO2: 98%    BP Readings from Last 3 Encounters:  03/12/17 120/84  10/02/16 118/74  09/29/16 127/81   Wt Readings from Last 3 Encounters:  03/12/17 201 lb 9.6 oz (91.4 kg)  10/02/16 215 lb (97.5 kg)  09/23/16 214 lb (97.1 kg)    Physical Exam  Constitutional: No distress.  HENT:  Head: Normocephalic and atraumatic.  Mouth/Throat: Oropharynx is clear and moist. No oropharyngeal exudate.  Eyes: Conjunctivae are normal. Pupils are equal, round, and reactive to light.  Neck:    Cardiovascular: Normal rate, regular rhythm and normal heart sounds.  Pulmonary/Chest: Effort normal and breath sounds normal.  Musculoskeletal: He exhibits no edema.  Neurological: He is  alert. Gait normal.  Skin: Skin is warm and dry. He is not diaphoretic.     Assessment/Plan: Please see individual problem list.  Multiple lipomas I encouraged him to monitor.  He will see Dr. Bary Castilla if needed for this.  Allergic rhinitis I encouraged Claritin or Allegra along with Flonase.  If symptoms do not improve he will let us know.  Overweight Congratulated on weight loss.  She will continue to work on diet and exercise.  Ingrown hair Suspect the area of concern at his right jawline was an ingrown hair.  It has improved quite a bit per his report.  He will monitor this and if it worsens again let us know.  We will plan on rechecking at his physical in 1 month.   No orders of the defined types were placed in this encounter.   No orders of the defined types were placed in this encounter.    Tommi Rumps, MD [place]

## 2017-03-12 NOTE — Assessment & Plan Note (Signed)
I encouraged Claritin or Allegra along with Flonase.  If symptoms do not improve he will let us know.

## 2017-03-12 NOTE — Assessment & Plan Note (Signed)
Congratulated on weight loss.  She will continue to work on diet and exercise.

## 2017-03-12 NOTE — Assessment & Plan Note (Signed)
I encouraged him to monitor.  He will see Dr. Bary Castilla if needed for this.

## 2017-03-12 NOTE — Assessment & Plan Note (Signed)
Suspect the area of concern at his right jawline was an ingrown hair.  It has improved quite a bit per his report.  He will monitor this and if it worsens again let us know.  We will plan on rechecking at his physical in 1 month.

## 2017-04-20 ENCOUNTER — Ambulatory Visit (INDEPENDENT_AMBULATORY_CARE_PROVIDER_SITE_OTHER): Payer: BLUE CROSS/BLUE SHIELD | Admitting: Family Medicine

## 2017-04-20 ENCOUNTER — Encounter: Payer: Self-pay | Admitting: Family Medicine

## 2017-04-20 VITALS — BP 108/86 | HR 79 | Temp 97.5°F | Ht 71.5 in | Wt 202.0 lb

## 2017-04-20 DIAGNOSIS — R413 Other amnesia: Secondary | ICD-10-CM

## 2017-04-20 DIAGNOSIS — E663 Overweight: Secondary | ICD-10-CM | POA: Diagnosis not present

## 2017-04-20 DIAGNOSIS — L408 Other psoriasis: Secondary | ICD-10-CM

## 2017-04-20 DIAGNOSIS — R21 Rash and other nonspecific skin eruption: Secondary | ICD-10-CM | POA: Diagnosis not present

## 2017-04-20 DIAGNOSIS — Z13220 Encounter for screening for lipoid disorders: Secondary | ICD-10-CM

## 2017-04-20 DIAGNOSIS — Z0001 Encounter for general adult medical examination with abnormal findings: Secondary | ICD-10-CM | POA: Diagnosis not present

## 2017-04-20 NOTE — Assessment & Plan Note (Signed)
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  There are no mass lesions.  Does not seem consistent with a sexually transmitted issue and I did discuss this with the patient and his girlfriend who was present for the visit.  We will refer back to dermatology for reevaluation.

## 2017-04-20 NOTE — Patient Instructions (Signed)
Nice to see you. We will have you return for fasting lab work. Please work on diet and exercise. We will get you to see dermatology.

## 2017-04-20 NOTE — Assessment & Plan Note (Signed)
Physical exam completed.  Discussed diet and exercise.  Congratulated on smoking cessation.  Lab work as outlined below.
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
## 2017-04-20 NOTE — Assessment & Plan Note (Signed)
Reports these have improved.

## 2017-11-30 ENCOUNTER — Ambulatory Visit: Payer: Self-pay | Admitting: *Deleted

## 2017-11-30 NOTE — Telephone Encounter (Signed)
Swollen foot- injured foot a few days ago- 5-6 days. Patient has been icing and heating it-  R foot- shin to top of foot.  Reason for Disposition . Large swelling or bruise (> 2 inches or 5 cm)    Patient injured foot climbing off machinery at work- he landed wrong. It has not gotten better- still having pain. Appointment to assess.  Answer Assessment - Initial Assessment Questions 1. MECHANISM: "How did the injury happen?" (e.g., twisting injury, direct blow)      Stepping off yard truck at work 2. ONSET: "When did the injury happen?" (Minutes or hours ago)      5-6 days ago 3. LOCATION: "Where is the injury located?"      R foot/shin/ankle 4. APPEARANCE of INJURY: "What does the injury look like?"      Swelling- no briusing 5. WEIGHT-BEARING: "Can you put weight on that foot?" "Can you walk (four steps or more)?"       Able to walking since injury 6. SIZE: For cuts, bruises, or swelling, ask: "How large is it?" (e.g., inches or centimeters;  entire joint)      No cuts 7. PAIN: "Is there pain?" If so, ask: "How bad is the pain?"    (e.g., Scale 1-10; or mild, moderate, severe)     Now- shooting pain-7-8 8. TETANUS: For any breaks in the skin, ask: "When was the last tetanus booster?"     Not recorded 9. OTHER SYMPTOMS: "Do you have any other symptoms?"      Swelling- ankle and top of foot 10. PREGNANCY: "Is there any chance you are pregnant?" "When was your last menstrual period?"       n/a  Protocols used: FOOT AND ANKLE INJURY-A-AH

## 2017-11-30 NOTE — Telephone Encounter (Signed)
FYI

## 2017-12-01 ENCOUNTER — Ambulatory Visit (INDEPENDENT_AMBULATORY_CARE_PROVIDER_SITE_OTHER): Payer: BLUE CROSS/BLUE SHIELD | Admitting: Internal Medicine

## 2017-12-01 ENCOUNTER — Ambulatory Visit (INDEPENDENT_AMBULATORY_CARE_PROVIDER_SITE_OTHER): Payer: BLUE CROSS/BLUE SHIELD

## 2017-12-01 ENCOUNTER — Telehealth: Payer: Self-pay | Admitting: Family Medicine

## 2017-12-01 ENCOUNTER — Encounter: Payer: Self-pay | Admitting: Internal Medicine

## 2017-12-01 VITALS — BP 128/78 | HR 78 | Temp 98.2°F | Ht 71.5 in | Wt 206.2 lb

## 2017-12-01 DIAGNOSIS — M79661 Pain in right lower leg: Secondary | ICD-10-CM

## 2017-12-01 DIAGNOSIS — Z1389 Encounter for screening for other disorder: Secondary | ICD-10-CM

## 2017-12-01 DIAGNOSIS — Z13220 Encounter for screening for lipoid disorders: Secondary | ICD-10-CM | POA: Diagnosis not present

## 2017-12-01 DIAGNOSIS — M79604 Pain in right leg: Secondary | ICD-10-CM

## 2017-12-01 DIAGNOSIS — E559 Vitamin D deficiency, unspecified: Secondary | ICD-10-CM

## 2017-12-01 DIAGNOSIS — Z0000 Encounter for general adult medical examination without abnormal findings: Secondary | ICD-10-CM | POA: Diagnosis not present

## 2017-12-01 DIAGNOSIS — Z1329 Encounter for screening for other suspected endocrine disorder: Secondary | ICD-10-CM

## 2017-12-01 NOTE — Progress Notes (Signed)
Pre visit review using our clinic review tool, if applicable. No additional management support is needed unless otherwise documented below in the visit note. 

## 2017-12-01 NOTE — Progress Notes (Signed)
Chief Complaint  Patient presents with  . Leg Swelling   Acute visit  5-6 days ago missed step while working out and had right lower shin pain and swelling and redness. Pain is worse after working a full day, denies calf swelling or pain. Pain is 5-6/10 tried ice, heat, tylenol and ibuprofen and compression with some relief. Swelling has reduced slightly.    Review of Systems  Constitutional: Negative for weight loss.  HENT: Negative for hearing loss.   Eyes: Negative for blurred vision.  Respiratory: Negative for shortness of breath.   Cardiovascular: Negative for chest pain.  Musculoskeletal: Positive for joint pain.       +right lower shin pain   Skin: Negative for rash.  Neurological: Negative for headaches.  Psychiatric/Behavioral: Negative for depression.   Past Medical History:  Diagnosis Date  . Allergy   . Chicken pox   . GERD (gastroesophageal reflux disease)   . History of pilonidal cyst 02/14/2015  . Psoriasis    Past Surgical History:  Procedure Laterality Date  . ANAL FISTULOTOMY N/A 04/07/2016   Procedure: ANAL FISTULOTOMY;  Surgeon: Robert Bellow, MD;  Location: [facility] ORS;  Service: General;  Laterality: N/A;  . CYSTECTOMY  09/10/12.  Marland Kitchen LIPOMA EXCISION Bilateral 09/29/2016   Procedure: EXCISION LIPOMA-BILATERAL ARM;  Surgeon: Robert Bellow, MD;  Location: [facility] ORS;  Service: General;  Laterality: Bilateral;  . lung collapsed  2000  . PILONIDAL CYST EXCISION  09/10/12   Family History  Problem Relation Age of Onset  . Diabetes Mother   . Diabetes Paternal Uncle   . Diabetes Paternal Grandmother   . Diabetes Paternal Uncle    Social History   Socioeconomic History  . Marital status: Single    Spouse name: Not on file  . Number of children: Not on file  . Years of education: Not on file  . Highest education level: Not on file  Occupational History  . Not on file  Social Needs  . Financial resource strain: Not on file  . Food insecurity:   Worry: Not on file    Inability: Not on file  . Transportation needs:    Medical: Not on file    Non-medical: Not on file  Tobacco Use  . Smoking status: Former Smoker    Packs/day: 1.00    Years: 5.00    Pack years: 5.00    Types: Cigarettes    Last attempt to quit: 01/20/2014    Years since quitting: 3.8  . Smokeless tobacco: Never Used  Substance and Sexual Activity  . Alcohol use: No    Alcohol/week: 0.0 standard drinks  . Drug use: No  . Sexual activity: Yes  Lifestyle  . Physical activity:    Days per week: Not on file    Minutes per session: Not on file  . Stress: Not on file  Relationships  . Social connections:    Talks on phone: Not on file    Gets together: Not on file    Attends religious service: Not on file    Active member of club or organization: Not on file    Attends meetings of clubs or organizations: Not on file    Relationship status: Not on file  . Intimate partner violence:    Fear of current or ex partner: Not on file    Emotionally abused: Not on file    Physically abused: Not on file    Forced sexual activity: Not on file  Other  Topics Concern  . Not on file  Social History Narrative  . Not on file   No outpatient medications have been marked as taking for the 12/01/17 encounter (Office Visit) with McLean-Scocuzza, Nino Glow, MD.   No Known Allergies No results found for this or any previous visit (from the past 2160 hour(s)). Objective  Body mass index is 28.36 kg/m. Wt Readings from Last 3 Encounters:  12/01/17 206 lb 3.2 oz (93.5 kg)  04/20/17 202 lb (91.6 kg)  03/12/17 201 lb 9.6 oz (91.4 kg)   Temp Readings from Last 3 Encounters:  12/01/17 98.2 F (36.8 C) (Oral)  04/20/17 (!) 97.5 F (36.4 C) (Oral)  03/12/17 98.5 F (36.9 C) (Oral)   BP Readings from Last 3 Encounters:  12/01/17 128/78  04/20/17 108/86  03/12/17 120/84   Pulse Readings from Last 3 Encounters:  12/01/17 78  04/20/17 79  03/12/17 72    Physical Exam    Constitutional: He is oriented to person, place, and time. Vital signs are normal. He appears well-developed and well-nourished. He is cooperative.  HENT:  Head: Normocephalic and atraumatic.  Mouth/Throat: Oropharynx is clear and moist and mucous membranes are normal.  Eyes: Pupils are equal, round, and reactive to light. Conjunctivae are normal.  Cardiovascular: Normal rate, regular rhythm and normal heart sounds.  Pulmonary/Chest: Effort normal and breath sounds normal.  Musculoskeletal:       Right ankle: No tenderness. No lateral malleolus and no medial malleolus tenderness found.       Feet:  Neurological: He is alert and oriented to person, place, and time. Gait normal.  Skin: Skin is warm and dry. There is erythema.     Psychiatric: He has a normal mood and affect. His behavior is normal. Judgment and thought content normal. Cognition and memory are normal.  Nursing note and vitals reviewed.   Assessment   1. Right lower shin pain and swelling after missed step with working out  -r/o fracture could be MSK injury vs hematoma/soft tissue injury 2. HM Plan   1. otc tylenol, nsaid, ice, compression and elevation  If not better call back to office in no more than 2 weeks Xray tib/fib neg will do Xray right ankle to be sure no other fractures If pain continues consider check d dimer r/o DVT though less likely today vs MRI  2.  Disc flu shot at f/u with PCP  Tdap had 03/26/10  sch fasting labs and f/u with PCP in 3 months   Provider: Dr. Olivia Mackie McLean-Scocuzza-Internal Medicine

## 2017-12-01 NOTE — Telephone Encounter (Signed)
Pt needs labs is it fasting or non fasting? Please advise? Thank you!

## 2017-12-01 NOTE — Telephone Encounter (Signed)
Called patient and left a VM to call me back directly to see if he wanted to be schedule for an appt with Dr. Caryl Bis and to find out if he is filing worker's comp since injury happen at work.   Advised patient to call me back at [phone]

## 2017-12-01 NOTE — Patient Instructions (Signed)
Tylenol and Ibuprofen as needed for pain  Ice and elevation  ACE wrap   Ankle Pain Many things can cause ankle pain, including an injury to the area and overuse of the ankle.The ankle joint holds your body weight and allows you to move around. Ankle pain can occur on either side or the back of one ankle or both ankles. Ankle pain may be sharp and burning or dull and aching. There may be tenderness, stiffness, redness, or warmth around the ankle. Follow these instructions at home: Activity  Rest your ankle as told by your health care provider. Avoid any activities that cause ankle pain.  Do exercises as told by your health care provider.  Ask your health care provider if you can drive. Using a brace, a bandage, or crutches  If you were given a brace: ? Wear it as told by your health care provider. ? Remove it when you take a bath or a shower. ? Try not to move your ankle very much, but wiggle your toes from time to time. This helps to prevent swelling.  If you were given an elastic bandage: ? Remove it when you take a bath or a shower. ? Try not to move your ankle very much, but wiggle your toes from time to time. This helps to prevent swelling. ? Adjust the bandage to make it more comfortable if it feels too tight. ? Loosen the bandage if you have numbness or tingling in your foot or if your foot turns cold and blue.  If you have crutches, use them as told by your health care provider. Continue to use them until you can walk without feeling pain in your ankle. Managing pain, stiffness, and swelling  Raise (elevate) your ankle above the level of your heart while you are sitting or lying down.  If directed, apply ice to the area: ? Put ice in a plastic bag. ? Place a towel between your skin and the bag. ? Leave the ice on for 20 minutes, 2-3 times per day. General instructions  Keep all follow-up visits as told by your health care provider. This is important.  Record this  information that may be helpful for you and your health care provider: ? How often you have ankle pain. ? Where the pain is located. ? What the pain feels like.  Take over-the-counter and prescription medicines only as told by your health care provider. Contact a health care provider if:  Your pain gets worse.  Your pain is not relieved with medicines.  You have a fever or chills.  You are having more trouble with walking.  You have new symptoms. Get help right away if:  Your foot, leg, toes, or ankle tingles or becomes numb.  Your foot, leg, toes, or ankle becomes swollen.  Your foot, leg, toes, or ankle turns pale or blue. This information is not intended to replace advice given to you by your health care provider. Make sure you discuss any questions you have with your health care provider. Document Released: 06/26/2009 Document Revised: 09/07/2015 Document Reviewed: 08/08/2014 Elsevier Interactive Patient Education  [employer].

## 2017-12-02 ENCOUNTER — Ambulatory Visit (INDEPENDENT_AMBULATORY_CARE_PROVIDER_SITE_OTHER): Payer: BLUE CROSS/BLUE SHIELD

## 2017-12-02 DIAGNOSIS — M79604 Pain in right leg: Secondary | ICD-10-CM | POA: Diagnosis not present

## 2017-12-02 DIAGNOSIS — M79661 Pain in right lower leg: Secondary | ICD-10-CM | POA: Diagnosis not present

## 2017-12-02 NOTE — Telephone Encounter (Signed)
Called and spoke with patient. Pt has a CPE scheduled for 04/2018 and he is OK with waiting to do labs at that time.

## 2017-12-02 NOTE — Telephone Encounter (Signed)
Called and spoke with patient. Pt stated that the injury happen while he was working out not at work. Pt stated that he has already came to our office to do x-rays but has not heard back yet on the results.   Pt would like a call once results are in.   Thanks

## 2017-12-02 NOTE — Telephone Encounter (Signed)
Advise pt if not better call back   tMS

## 2017-12-03 NOTE — Telephone Encounter (Signed)
Please see result note 

## 2017-12-14 ENCOUNTER — Encounter: Payer: Self-pay | Admitting: *Deleted

## 2017-12-19 ENCOUNTER — Encounter: Payer: Self-pay | Admitting: Emergency Medicine

## 2017-12-19 ENCOUNTER — Emergency Department: Payer: PRIVATE HEALTH INSURANCE

## 2017-12-19 ENCOUNTER — Other Ambulatory Visit: Payer: Self-pay

## 2017-12-19 ENCOUNTER — Emergency Department
Admission: EM | Admit: 2017-12-19 | Discharge: 2017-12-19 | Disposition: A | Discharge status: Home / Self Care | Payer: PRIVATE HEALTH INSURANCE | Attending: Emergency Medicine | Admitting: Emergency Medicine

## 2017-12-19 DIAGNOSIS — W010XXA Fall on same level from slipping, tripping and stumbling without subsequent striking against object, initial encounter: Secondary | ICD-10-CM | POA: Diagnosis not present

## 2017-12-19 DIAGNOSIS — S8992XA Unspecified injury of left lower leg, initial encounter: Secondary | ICD-10-CM | POA: Diagnosis present

## 2017-12-19 DIAGNOSIS — Y9289 Other specified places as the place of occurrence of the external cause: Secondary | ICD-10-CM | POA: Diagnosis not present

## 2017-12-19 DIAGNOSIS — Y9389 Activity, other specified: Secondary | ICD-10-CM | POA: Insufficient documentation

## 2017-12-19 DIAGNOSIS — Z87891 Personal history of nicotine dependence: Secondary | ICD-10-CM | POA: Insufficient documentation

## 2017-12-19 DIAGNOSIS — M23201 Derangement of unspecified lateral meniscus due to old tear or injury, left knee: Secondary | ICD-10-CM | POA: Insufficient documentation

## 2017-12-19 DIAGNOSIS — Y990 Civilian activity done for income or pay: Secondary | ICD-10-CM | POA: Diagnosis not present

## 2017-12-19 MED ORDER — MELOXICAM 7.5 MG PO TABS
15.0000 mg | ORAL_TABLET | Freq: Once | ORAL | Status: AC
  Administered 2017-12-19: 15 mg via ORAL
  Filled 2017-12-19: qty 2

## 2017-12-19 MED ORDER — MELOXICAM 15 MG PO TABS: 15.0000 mg | ORAL_TABLET | Freq: Every day | ORAL | 0 refills | Status: DC

## 2017-12-19 NOTE — ED Triage Notes (Signed)
Pt arrives POV to triage with c/o left knee pain. Pt reports that he was getting out of his work truck and slipped and his left knee "buckled up under me". Pt is ambulatory to triage at this time and in NAD. Per supervisor Beckey Rutter, no testing needs to be completed at this time as they will follow up with their own lab.

## 2017-12-19 NOTE — ED Notes (Addendum)
Pt ambulatory from triage. NAD noted. Pt does have a noted limp with two moments while ambulating that pt stutter stepped and grimaced. No swelling or discoloration noted. Pt reports no snap or pops felt but sudden pain and burning sensation.

## 2017-12-19 NOTE — ED Provider Notes (Signed)
[facility] Emergency Department Provider Note  ____________________________________________  Time seen: Approximately 10:35 PM  I have reviewed the triage vital signs and the nursing notes.   HISTORY  Chief Complaint Knee Pain    HPI Marcus Clayton is a 37 y.o. male who presents the emergency department complaining of left knee pain/injury.  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  Patient reports that his right foot slipped, he tried to plant his left leg and felt his knee hyperextend.  Patient was able to walk after the injury but had increasing pain and stiffness to the knee.  Patient did not fall and hit his head.  No medications for his complaint prior to arrival.  No history of previous knee injury.  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   No other injury or complaint.   Past Medical History:  Diagnosis Date  . Allergy   . Chicken pox   . GERD (gastroesophageal reflux disease)   . History of pilonidal cyst 02/14/2015  . Psoriasis     Patient Active Problem List   Diagnosis Date Noted  . Allergic rhinitis 03/12/2017  . Overweight 03/12/2017  . Ingrown hair 03/12/2017  . Rash 07/23/2015  . Encounter for general adult medical examination with abnormal findings 02/14/2015  . 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 Memory difficulties 02/14/2015  . 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 History of pneumothorax 02/14/2015    Past Surgical History:  Procedure Laterality Date  . ANAL FISTULOTOMY N/A 04/07/2016   Procedure: ANAL FISTULOTOMY;  Surgeon: Robert Bellow, MD;  Location: [facility] ORS;  Service: General;  Laterality: N/A;  . CYSTECTOMY  09/10/12.  Marland Kitchen LIPOMA EXCISION Bilateral 09/29/2016   Procedure: EXCISION LIPOMA-BILATERAL ARM;  Surgeon: Robert Bellow, MD;  Location: [facility] ORS;  Service: General;  Laterality: Bilateral;  . lung collapsed  2000  . PILONIDAL CYST  EXCISION  09/10/12    Prior to Admission medications   Medication Sig Start Date End Date Taking? Authorizing Provider  calcium carbonate (TUMS - DOSED IN MG ELEMENTAL CALCIUM) 500 MG chewable tablet Chew 2 tablets by mouth daily as needed for indigestion or heartburn.    [provider]  ibuprofen (ADVIL,MOTRIN) 200 MG tablet Take 200 mg by mouth every 6 (six) hours as needed.    [provider]  Multiple Vitamin (MULTIVITAMIN) tablet Take 1 tablet by mouth daily.    [provider]    Allergies Patient has no known allergies.  Family History  Problem Relation Age of Onset  . Diabetes Mother   . Diabetes Paternal Uncle   . Diabetes Paternal Grandmother   . Diabetes Paternal Uncle     Social History Social History   Tobacco Use  . Smoking status: Former Smoker    Packs/day: 1.00    Years: 5.00    Pack years: 5.00    Types: Cigarettes    Last attempt to quit: 01/20/2014    Years since quitting: 3.9  . Smokeless tobacco: Never Used  Substance Use Topics  . Alcohol use: No    Alcohol/week: 0.0 standard drinks  . Drug use: No     Review of Systems  Constitutional: No fever/chills Eyes: No visual changes. Cardiovascular: no chest pain. Respiratory: no cough. No SOB. Gastrointestinal: No abdominal pain.  No nausea, no vomiting.   Musculoskeletal: Positive for left knee injury/pain Skin: Negative for rash, abrasions, lacerations, ecchymosis.  Neurological: Negative for headaches, focal weakness or numbness. 10-point ROS otherwise negative.  ____________________________________________   PHYSICAL EXAM:  VITAL SIGNS: ED Triage Vitals [12/19/17 2126]  Enc Vitals Group     BP 107/61     Pulse Rate 85     Resp 18     Temp 98 F (36.7 C)     Temp Source Oral     SpO2 100 %     Weight 200 lb (90.7 kg)     Height 6' (1.829 m)     Head Circumference      Peak Flow      Pain Score 8     Pain Loc      Pain Edu?      Excl. in [place]?       Constitutional: Alert and oriented. Well appearing and in no acute distress. Eyes: Conjunctivae are normal. PERRL. EOMI. Head: Atraumatic. Neck: No stridor.    Cardiovascular: Normal rate, regular rhythm. Normal S1 and S2.  Good peripheral circulation. Respiratory: Normal respiratory effort without tachypnea or retractions. Lungs CTAB. Good air entry to the bases with no decreased or absent breath sounds. Musculoskeletal: Full range of motion to all extremities. No gross deformities appreciated.  Visualization of the left knee reveals mild edema when compared with unaffected knee.  Patient is able to flex and extend the knee.  Patient is very tender to palpation along the lateral joint line with no palpable abnormality.  Mild palpable ballottement in the suprapatellar region.  No other palpable findings to the left knee.  Varus, valgus, Lachman's is negative.  McMurray's is positive for medial meniscal derangement.  Dorsalis pedis pulse intact distally.  Sensation intact distally. Neurologic:  Normal speech and language. No gross focal neurologic deficits are appreciated.  Skin:  Skin is warm, dry and intact. No rash noted. Psychiatric: Mood and affect are normal. Speech and behavior are normal. Patient exhibits appropriate insight and judgement.   ____________________________________________   LABS (all labs ordered are listed, but only abnormal results are displayed)  Labs Reviewed - No data to display ____________________________________________  EKG   ____________________________________________  RADIOLOGY I personally viewed and evaluated these images as part of my medical decision making, as well as reviewing the written report by the radiologist.  Small suprapatellar effusion, no fractures.  Dg Knee Complete 4 Views Left  Result Date: 12/19/2017 CLINICAL DATA:  Pain. EXAM: LEFT KNEE - COMPLETE 4+ VIEW COMPARISON:  None. FINDINGS: There is a small suprapatellar joint  effusion. No acute fracture seen. IMPRESSION: Small joint effusion.  No identified fracture. Electronically Signed   By: Dorise Bullion III M.D   On: 12/19/2017 21:48    ____________________________________________    PROCEDURES  Procedure(s) performed:    .Splint Application Date/Time: 52/84/1324 10:42 PM Performed by: Darletta Moll, PA-C Authorized by: Darletta Moll, PA-C   Consent:    Consent obtained:  Verbal   Consent given by:  Patient   Risks discussed:  Pain and swelling Pre-procedure details:    Sensation:  Normal Procedure details:    Laterality:  Left   Location:  Knee   Knee:  L knee   Splint type:  Knee immobilizer   Supplies:  Prefabricated splint Post-procedure details:    Pain:  Unchanged   Sensation:  Normal   Patient tolerance of procedure:  Tolerated well, no immediate complications      Medications  meloxicam (MOBIC) tablet 15 mg (has no administration in time range)  ____________________________________________   INITIAL IMPRESSION / ASSESSMENT AND PLAN / ED COURSE  Pertinent labs & imaging results that were available during my care of the patient were reviewed by me and considered in my medical decision making (see chart for details).  Review of the [place] CSRS was performed in accordance of the [place] prior to dispensing any controlled drugs.      Patient's diagnosis is consistent with left knee injury.  Patient presents the emergency department after injuring the left knee at work.  On exam, patient has mild ballottement consistent with suprapatellar effusion which is also confirmed on x-ray.  No other acute osseous abnormality on x-ray.  Special test of the knee reveals positive McMurray's test for lateral meniscal derangement.  Patient also has tenderness to palpation along the lateral joint line.  At this point, patient will be immobilized with knee immobilizer, placed on crutches.  He will follow-up with orthopedics for  further management of left knee injury..  Patient will be prescribed meloxicam for additional symptom control.  Patient is given ED precautions to return to the ED for any worsening or new symptoms.     ____________________________________________  FINAL CLINICAL IMPRESSION(S) / ED DIAGNOSES  Final diagnoses:  Injury of left knee, initial encounter      NEW MEDICATIONS STARTED DURING THIS VISIT:  ED Discharge Orders    None          This chart was dictated using voice recognition software/Dragon. Despite best efforts to proofread, errors can occur which can change the meaning. Any change was purely unintentional.    Darletta Moll, PA-C 12/19/17 2246    Nance Pear, MD 12/19/17 2255

## 2018-01-27 ENCOUNTER — Other Ambulatory Visit
Admission: RE | Admit: 2018-01-27 | Discharge: 2018-01-27 | Disposition: A | Discharge status: Home / Self Care | Payer: BLUE CROSS/BLUE SHIELD | Source: Ambulatory Visit | Attending: Gastroenterology | Admitting: Gastroenterology

## 2018-01-27 DIAGNOSIS — K625 Hemorrhage of anus and rectum: Secondary | ICD-10-CM | POA: Diagnosis present

## 2018-01-27 DIAGNOSIS — K6289 Other specified diseases of anus and rectum: Secondary | ICD-10-CM | POA: Insufficient documentation

## 2018-01-27 DIAGNOSIS — K603 Anal fistula: Secondary | ICD-10-CM | POA: Insufficient documentation

## 2018-02-02 LAB — MISCELLANEOUS TEST

## 2018-02-16 ENCOUNTER — Other Ambulatory Visit: Payer: Self-pay | Admitting: Gastroenterology

## 2018-02-16 DIAGNOSIS — K603 Anal fistula: Secondary | ICD-10-CM

## 2018-02-26 ENCOUNTER — Ambulatory Visit
Admission: RE | Admit: 2018-02-26 | Discharge: 2018-02-26 | Disposition: A | Discharge status: Home / Self Care | Payer: BLUE CROSS/BLUE SHIELD | Source: Ambulatory Visit | Attending: Gastroenterology | Admitting: Gastroenterology

## 2018-02-26 DIAGNOSIS — K603 Anal fistula: Secondary | ICD-10-CM | POA: Insufficient documentation

## 2018-02-26 MED ORDER — GADOBUTROL 1 MMOL/ML IV SOLN
9.0000 mL | Freq: Once | INTRAVENOUS | Status: AC | PRN
  Administered 2018-02-26: 9 mL via INTRAVENOUS

## 2018-03-05 ENCOUNTER — Ambulatory Visit: Payer: BLUE CROSS/BLUE SHIELD | Admitting: Family Medicine

## 2018-03-09 ENCOUNTER — Encounter: Payer: Self-pay | Admitting: General Surgery

## 2018-03-09 ENCOUNTER — Other Ambulatory Visit: Payer: Self-pay

## 2018-03-09 ENCOUNTER — Ambulatory Visit (INDEPENDENT_AMBULATORY_CARE_PROVIDER_SITE_OTHER): Payer: BLUE CROSS/BLUE SHIELD | Admitting: General Surgery

## 2018-03-09 VITALS — BP 148/83 | HR 109 | Temp 99.0°F | Resp 20 | Ht 72.0 in | Wt 204.0 lb

## 2018-03-09 DIAGNOSIS — L0231 Cutaneous abscess of buttock: Secondary | ICD-10-CM

## 2018-03-09 MED ORDER — METRONIDAZOLE 500 MG PO TABS: 500.0000 mg | ORAL_TABLET | Freq: Three times a day (TID) | ORAL | 0 refills | Status: AC

## 2018-03-09 MED ORDER — CIPROFLOXACIN HCL 500 MG PO TABS: 500.0000 mg | ORAL_TABLET | Freq: Two times a day (BID) | ORAL | 0 refills | Status: DC

## 2018-03-09 NOTE — Patient Instructions (Addendum)
May shower Change dressing as needed Warm sitz bath soaks

## 2018-03-09 NOTE — Progress Notes (Signed)
Patient ID: Marcus Clayton, male   DOB: [birth year], 38 y.o.   MRN: [id number]  Chief Complaint  Patient presents with  . New Patient (Initial Visit)    ref [name] Perianal Fistula    HPI Marcus Clayton is a 38 y.o. male.  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 The patient reports that he was asymptomatic from the previous (March 2018) fistulotomy (internal opening not identified) at the time of his 2017 exam for multiple lipoma excisions and up until September/October 2019.  After that he became aware of a rare bit of drainage but more in odor which prompted him to seek medical attention in January with the GI service.  There is been no change in his regular 2-3 times per day soft stools.  No history of inflammatory bowel disease.  The patient reported he developed acute onset of pain in the right gluteal area today.  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    He states the area started swelling like a "blister" and causing some pain today.  He was prescribed Augmentin 875 mg p.o. twice daily x2 courses by Ms. Laurine Blazer, PA at [facility].    MRI was 02-27-18.  HPI  Past Medical History:  Diagnosis Date  . Allergy   . Chicken pox   . GERD (gastroesophageal reflux disease)   . History of pilonidal cyst 02/14/2015  . Psoriasis     Past Surgical History:  Procedure Laterality Date  . ANAL FISTULOTOMY N/A 04/07/2016   Procedure: ANAL FISTULOTOMY;  Surgeon: Robert Bellow, MD;  Location: [facility] ORS;  Service: General;  Laterality: N/A;  . CYSTECTOMY  09/10/12.  Marland Kitchen LIPOMA EXCISION Bilateral 09/29/2016   Procedure: EXCISION LIPOMA-BILATERAL ARM;  Surgeon: Robert Bellow, MD;  Location: [facility] ORS;  Service: General;  Laterality: Bilateral;  . lung collapsed  2000  . PILONIDAL CYST EXCISION  09/10/12    Family History  Problem Relation Age of Onset  .  Diabetes Mother   . Diabetes Paternal Uncle   . Diabetes Paternal Grandmother   . Diabetes Paternal Uncle     Social History Social History   Tobacco Use  . Smoking status: Former Smoker    Packs/day: 1.00    Years: 5.00    Pack years: 5.00    Types: Cigarettes    Last attempt to quit: 01/20/2014    Years since quitting: 4.1  . Smokeless tobacco: Never Used  Substance Use Topics  . Alcohol use: No    Alcohol/week: 0.0 standard drinks  . Drug use: No    No Known Allergies  Current Outpatient Medications  Medication Sig Dispense Refill  . CREAM BASE EX Apply topically.    . ciprofloxacin (CIPRO) 500 MG tablet Take 1 tablet (500 mg total) by mouth 2 (two) times daily for 10 days. 20 tablet 0  . metroNIDAZOLE (FLAGYL) 500 MG tablet Take 1 tablet (500 mg total) by mouth 3 (three) times daily for 10 days. 42 tablet 0   No current facility-administered medications for this visit.     Review of Systems Review of Systems  Constitutional: Negative.   Respiratory: Negative.     Blood pressure (!) 148/83, pulse (!) 109, temperature 99 F (37.2 C), temperature source Temporal, resp. rate 20, height 6' (1.829 m), weight 204 lb (92.5 kg), SpO2 96 %.  Physical Exam Physical  Exam Constitutional:      Appearance: He is well-developed.  HENT:     Mouth/Throat:     Pharynx: No oropharyngeal exudate.  Eyes:     General: No scleral icterus.    Conjunctiva/sclera: Conjunctivae normal.  Neck:     Musculoskeletal: Neck supple.  Cardiovascular:     Rate and Rhythm: Normal rate and regular rhythm.     Heart sounds: Normal heart sounds.  Pulmonary:     Effort: Pulmonary effort is normal.     Breath sounds: Normal breath sounds.  Abdominal:     Palpations: Abdomen is soft.  Genitourinary:   Lymphadenopathy:     Lower Body: No right inguinal adenopathy. No left inguinal adenopathy.  Skin:    General: Skin is warm and dry.  Neurological:     Mental Status: He is alert and  oriented to person, place, and time.  Psychiatric:        Behavior: Behavior normal.     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 Thick enhancing fistula tract wall indicates active inflammation, with no abscess.    Assessment    Interval development of right gluteal abscess.    Plan    In light of the patient's low-grade fever and tachycardia he was felt to be a candidate for incision and drainage.  The procedure was reviewed.  The area was cleansed with Betadine solution followed by 10 cc of 0.5% Xylocaine with 0.25% Marcaine with 1 to 200,000 units of epinephrine.  This was well-tolerated.  The area was recleansed with Betadine solution and draped.  A 1.5 cm radial incision was made over the area of marked induration with return of about 2 cc of purulent fluid which was cultured.  Probing with a hemostat showed a few small loculated areas.  About 20 cc of blood controlled with direct pressure.  Dry pad applied.  Wound care reviewed, warm compresses to help resolve the inflammation.  As he is already had 2 courses of Augmentin will prescribe Cipro 500 mg p.o. twice daily and Flagyl 500 mg 3 times daily.  Advised not to make use of alcohol.  We will reassess in 2 days.      HPI, assessment, plan and physical exam has been scribed under the direction and in the presence of Robert Bellow, MD. Karie Fetch, RN  [name] Byrnett 03/09/2018, 5:28 PM

## 2018-03-11 ENCOUNTER — Ambulatory Visit (INDEPENDENT_AMBULATORY_CARE_PROVIDER_SITE_OTHER): Payer: BLUE CROSS/BLUE SHIELD | Admitting: General Surgery

## 2018-03-11 ENCOUNTER — Encounter: Payer: Self-pay | Admitting: General Surgery

## 2018-03-11 ENCOUNTER — Other Ambulatory Visit: Payer: Self-pay

## 2018-03-11 VITALS — BP 114/73 | HR 89 | Temp 99.0°F | Resp 18 | Ht 72.0 in | Wt 206.0 lb

## 2018-03-11 DIAGNOSIS — L0231 Cutaneous abscess of buttock: Secondary | ICD-10-CM

## 2018-03-11 NOTE — Progress Notes (Signed)
Patient ID: Marcus Clayton, male   DOB: 11-Nov-1980, 38 y.o.   MRN: [id number]  Chief Complaint  Patient presents with  . Routine Post Op     2 day post op I&D of abscess     HPI Marcus Clayton is a 38 y.o. male here today to follow up for I&D of abscess. Patient states he is sore, no fevers or chills.  The patient reports drainage is tailing off.  HPI  Past Medical History:  Diagnosis Date  . Allergy   . Chicken pox   . GERD (gastroesophageal reflux disease)   . History of pilonidal cyst 02/14/2015  . Psoriasis     Past Surgical History:  Procedure Laterality Date  . ANAL FISTULOTOMY N/A 04/07/2016   Procedure: ANAL FISTULOTOMY;  Surgeon: Robert Bellow, MD;  Location: [facility];  Service: General;  Laterality: N/A;  . CYSTECTOMY  09/10/12.  Marland Kitchen LIPOMA EXCISION Bilateral 09/29/2016   Procedure: EXCISION LIPOMA-BILATERAL ARM;  Surgeon: Robert Bellow, MD;  Location: [facility];  Service: General;  Laterality: Bilateral;  . lung collapsed  2000  . PILONIDAL CYST EXCISION  09/10/12    Family History  Problem Relation Age of Onset  . Diabetes Mother   . Diabetes Paternal Uncle   . Diabetes Paternal Grandmother   . Diabetes Paternal Uncle     Social History Social History   Tobacco Use  . Smoking status: Former Smoker    Packs/day: 1.00    Years: 5.00    Pack years: 5.00    Types: Cigarettes    Last attempt to quit: 01/20/2014    Years since quitting: 4.1  . Smokeless tobacco: Never Used  Substance Use Topics  . Alcohol use: No    Alcohol/week: 0.0 standard drinks  . Drug use: No    No Known Allergies  Current Outpatient Medications  Medication Sig Dispense Refill  . ciprofloxacin (CIPRO) 500 MG tablet Take 1 tablet (500 mg total) by mouth 2 (two) times daily for 10 days. 20 tablet 0  . CREAM BASE EX Apply topically.    . metroNIDAZOLE (FLAGYL) 500 MG tablet Take 1 tablet (500 mg total) by mouth 3 (three) times daily for 10 days. 42 tablet 0   No  current facility-administered medications for this visit.     Review of Systems Review of Systems  Constitutional: Negative.   Respiratory: Negative.   Cardiovascular: Negative.     Blood pressure 114/73, pulse 89, temperature 99 F (37.2 C), temperature source Temporal, resp. rate 18, height 6' (1.829 m), weight 206 lb (93.4 kg), SpO2 98 %.  Physical Exam Physical Exam Constitutional:      Appearance: He is well-developed.  Eyes:     General: No scleral icterus. Chest:     Breasts: Breasts are symmetrical.        Right: No inverted nipple, mass, nipple discharge, skin change or tenderness.        Left: No inverted nipple, mass, nipple discharge, skin change or tenderness.  Genitourinary:   Lymphadenopathy:     Cervical: No cervical adenopathy.  Neurological:     Mental Status: He is alert.     Data Reviewed Culture results from 2 days ago: Pending  Assessment Doing well post incision and drainage.   Plan The patient will continue use warm soaks.  Diaper cream to the perianal skin to minimize inflammation.  Complete present course of antibiotics.   HPI, Physical Exam, Assessment and Plan have been scribed  under the direction and in the presence of Hervey Ard, Md.  Eudelia Bunch R. Bobette Mo, CMA  [name] 03/11/2018, 7:42 PM   I have completed the exam and reviewed the above documentation for accuracy and completeness.  I agree with the above.  Haematologist has been used and any errors in dictation or transcription are unintentional.  Hervey Ard, M.D., F.A.C.S.

## 2018-03-11 NOTE — Patient Instructions (Addendum)
Patient will need to return to the office in in 1 week. Complete antibiotic and go to any local [facility] or pharmacy and pick up a small tube of baby diaper rash cream.   Call the office with any questions or concerns.

## 2018-03-15 ENCOUNTER — Telehealth: Payer: Self-pay

## 2018-03-15 ENCOUNTER — Other Ambulatory Visit: Payer: Self-pay | Admitting: General Surgery

## 2018-03-15 DIAGNOSIS — L0231 Cutaneous abscess of buttock: Secondary | ICD-10-CM

## 2018-03-15 LAB — ANAEROBIC AND AEROBIC CULTURE

## 2018-03-15 MED ORDER — DOXYCYCLINE HYCLATE 100 MG PO CAPS: 100.0000 mg | ORAL_CAPSULE | Freq: Two times a day (BID) | ORAL | 0 refills | Status: DC

## 2018-03-15 MED ORDER — DOXYCYCLINE HYCLATE 100 MG PO TABS: 100.0000 mg | ORAL_TABLET | Freq: Two times a day (BID) | ORAL | Status: DC

## 2018-03-15 NOTE — Telephone Encounter (Signed)
-----   Message from Robert Bellow, MD sent at 03/15/2018  2:04 PM EST ----- Please notify the patient to discontinue the Cipro, to continue flagyl (metronidazole)  New prescription for Doxycycline has been sent to his pharmacy.  ----- Message ----- From: Interface, [facility] Lab Results In Sent: 03/12/2018   7:36 AM EST To: Robert Bellow, MD

## 2018-03-15 NOTE — Progress Notes (Unsigned)
Staph resistant to Cipro. Will substitute doxycycline.

## 2018-03-15 NOTE — Telephone Encounter (Signed)
Notified patient as instructed, patient pleased. Discussed follow-up appointments, patient agrees He will start the new antibiotic today and stop taking the Cipro.

## 2018-03-16 ENCOUNTER — Ambulatory Visit: Payer: BLUE CROSS/BLUE SHIELD | Admitting: General Surgery

## 2018-03-18 ENCOUNTER — Ambulatory Visit (INDEPENDENT_AMBULATORY_CARE_PROVIDER_SITE_OTHER): Payer: BLUE CROSS/BLUE SHIELD | Admitting: General Surgery

## 2018-03-18 ENCOUNTER — Encounter: Payer: Self-pay | Admitting: General Surgery

## 2018-03-18 ENCOUNTER — Other Ambulatory Visit: Payer: Self-pay

## 2018-03-18 VITALS — BP 128/74 | HR 82 | Temp 97.8°F | Ht 72.0 in | Wt 205.0 lb

## 2018-03-18 DIAGNOSIS — K603 Anal fistula: Secondary | ICD-10-CM

## 2018-03-18 NOTE — Progress Notes (Signed)
Patient ID: Marcus Clayton, male   DOB: 01-Mar-1980, 38 y.o.   MRN: [id number]  Chief Complaint  Patient presents with  . Follow-up    HPI Marcus Clayton is a 38 y.o. male here today for his one week follow up perianal abscess. Patient states he is doing well.  The patient tolerated his antibiotics well.  HPI  Past Medical History:  Diagnosis Date  . Allergy   . Chicken pox   . GERD (gastroesophageal reflux disease)   . History of pilonidal cyst 02/14/2015  . Psoriasis     Past Surgical History:  Procedure Laterality Date  . ANAL FISTULOTOMY N/A 04/07/2016   Procedure: ANAL FISTULOTOMY;  Surgeon: Robert Bellow, MD;  Location: [facility];  Service: General;  Laterality: N/A;  . CYSTECTOMY  09/10/12.  Marland Kitchen LIPOMA EXCISION Bilateral 09/29/2016   Procedure: EXCISION LIPOMA-BILATERAL ARM;  Surgeon: Robert Bellow, MD;  Location: [facility];  Service: General;  Laterality: Bilateral;  . lung collapsed  2000  . PILONIDAL CYST EXCISION  09/10/12    Family History  Problem Relation Age of Onset  . Diabetes Mother   . Diabetes Paternal Uncle   . Diabetes Paternal Grandmother   . Diabetes Paternal Uncle     Social History Social History   Tobacco Use  . Smoking status: Former Smoker    Packs/day: 1.00    Years: 5.00    Pack years: 5.00    Types: Cigarettes    Last attempt to quit: 01/20/2014    Years since quitting: 4.1  . Smokeless tobacco: Never Used  Substance Use Topics  . Alcohol use: No    Alcohol/week: 0.0 standard drinks  . Drug use: No    No Known Allergies  Current Outpatient Medications  Medication Sig Dispense Refill  . CREAM BASE EX Apply topically.    Marland Kitchen doxycycline (VIBRAMYCIN) 100 MG capsule Take 1 capsule (100 mg total) by mouth 2 (two) times daily. 20 capsule 0  . metroNIDAZOLE (FLAGYL) 500 MG tablet Take 1 tablet (500 mg total) by mouth 3 (three) times daily for 10 days. 42 tablet 0   No current facility-administered medications for this  visit.     Review of Systems Review of Systems  Constitutional: Negative.   Respiratory: Negative.   Cardiovascular: Negative.     Blood pressure 128/74, pulse 82, temperature 97.8 F (36.6 C), temperature source Skin, height 6' (1.829 m), weight 205 lb (93 kg), SpO2 98 %.  Physical Exam Physical Exam Cardiovascular:     Rate and Rhythm: Normal rate.  Pulmonary:     Effort: Pulmonary effort is normal.  Genitourinary:   Neurological:     Mental Status: He is alert.     Data Reviewed Culture showed staph aureus.  MRSA.  (Patient originally treated with Cipro and Flagyl, Cipro replaced with doxycycline when culture results were received).  Assessment Excellent resolution of perirectal abscess with staph aureus.  Asymptomatic anal fistula.  Plan  The MRI obtained prior to evaluation shows a fistula tract.  The patient is totally asymptomatic.  He denies pain, drainage, induration or discomfort.  Exploration in March 2018 had failed to show an internal opening.  I reviewed the pros and cons of surgical intervention in asymptomatic patient versus early intervention should he become symptomatic.  At this time, he would prefer the latter.  I think this is reasonable.  Should he develop recurrent perianal inflammation he was asked to call early for reassessment.  Please call  our office if you have any problems and we can get you in to be seen.  HPI, Physical Exam, Assessment and Plan have been scribed under the direction and in the presence of Hervey Ard, MD.  Gaspar Cola, CMA  I have completed the exam and reviewed the above documentation for accuracy and completeness.  I agree with the above.  Haematologist has been used and any errors in dictation or transcription are unintentional.  Hervey Ard, M.D., F.A.C.S.  [name] Byrnett 03/18/2018, 8:14 PM

## 2018-03-18 NOTE — Patient Instructions (Signed)
Please call our office if you have any problems and we can get you in to be seen.

## 2018-03-30 ENCOUNTER — Encounter: Payer: Self-pay | Admitting: General Surgery

## 2018-03-30 ENCOUNTER — Other Ambulatory Visit: Payer: Self-pay

## 2018-03-30 ENCOUNTER — Ambulatory Visit (INDEPENDENT_AMBULATORY_CARE_PROVIDER_SITE_OTHER): Payer: BLUE CROSS/BLUE SHIELD | Admitting: General Surgery

## 2018-03-30 VITALS — BP 120/75 | HR 92 | Temp 97.7°F | Ht 72.0 in | Wt 200.0 lb

## 2018-03-30 DIAGNOSIS — L0591 Pilonidal cyst without abscess: Secondary | ICD-10-CM

## 2018-03-30 NOTE — Progress Notes (Signed)
Patient ID: Marcus Clayton, male   DOB: Jul 24, 1980, 38 y.o.   MRN: [id number]  Chief Complaint  Patient presents with  . Follow-up    HPI Marcus Clayton is a 38 y.o. male here today to discuss anal fistula surgery. He states the area is doing much better.  No pain or drainage at this time.  No difficulty with bowel or bladder function. HPI  Past Medical History:  Diagnosis Date  . Allergy   . Chicken pox   . GERD (gastroesophageal reflux disease)   . History of pilonidal cyst 02/14/2015  . Psoriasis     Past Surgical History:  Procedure Laterality Date  . ANAL FISTULOTOMY N/A 04/07/2016   Procedure: ANAL FISTULOTOMY;  Surgeon: Robert Bellow, MD;  Location: [facility];  Service: General;  Laterality: N/A;  . CYSTECTOMY  09/10/12.  Marland Kitchen LIPOMA EXCISION Bilateral 09/29/2016   Procedure: EXCISION LIPOMA-BILATERAL ARM;  Surgeon: Robert Bellow, MD;  Location: [facility];  Service: General;  Laterality: Bilateral;  . lung collapsed  2000  . PILONIDAL CYST EXCISION  09/10/12    Family History  Problem Relation Age of Onset  . Diabetes Mother   . Diabetes Paternal Uncle   . Diabetes Paternal Grandmother   . Diabetes Paternal Uncle     Social History Social History   Tobacco Use  . Smoking status: Former Smoker    Packs/day: 1.00    Years: 5.00    Pack years: 5.00    Types: Cigarettes    Last attempt to quit: 01/20/2014    Years since quitting: 4.1  . Smokeless tobacco: Never Used  Substance Use Topics  . Alcohol use: No    Alcohol/week: 0.0 standard drinks  . Drug use: No    No Known Allergies  Current Outpatient Medications  Medication Sig Dispense Refill  . CREAM BASE EX Apply topically.    Marland Kitchen ketoconazole (NIZORAL) 2 % cream      No current facility-administered medications for this visit.     Review of Systems Review of Systems  Constitutional: Negative.   Respiratory: Negative.   Cardiovascular: Negative.     Blood pressure 120/75, pulse  92, temperature 97.7 F (36.5 C), temperature source Skin, height 6' (1.829 m), weight 200 lb (90.7 kg), SpO2 99 %.  Physical Exam Physical Exam Constitutional:      Appearance: Normal appearance.  Neck:     Musculoskeletal: Normal range of motion and neck supple.  Cardiovascular:     Rate and Rhythm: Normal rate and regular rhythm.  Pulmonary:     Effort: Pulmonary effort is normal.     Breath sounds: Normal breath sounds.  Genitourinary:   Skin:    General: Skin is dry.  Neurological:     Mental Status: He is alert and oriented to person, place, and time.     Data Reviewed Culture from the abscess drainage of March 09, 2018 showed staph aureus, MRSA.  Assessment Resolution of perirectal abscess.  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  Plan  The patient is having no pain or drainage, and there was nothing to suggest a horseshoe fistula or abscess mandating exploration at this time.  Should the patient develop recurrent perianal pain he would be a candidate for exploration for attempted identification of the internal opening suggested on the MRI of February 26, 2018.  Patient to return as needed.The patient is aware to call back for any questions or concerns.   HPI, Physical Exam, Assessment and Plan  have been scribed under the direction and in the presence of Hervey Ard, MD.  Gaspar Cola, CMA  I have completed the exam and reviewed the above documentation for accuracy and completeness.  I agree with the above.  Haematologist has been used and any errors in dictation or transcription are unintentional.  Hervey Ard, M.D., F.A.C.S.  [name] Sohaib Vereen 03/31/2018, 10:32 AM

## 2018-03-30 NOTE — Patient Instructions (Signed)
Patient to return as needed. The patient is aware to call back for any questions or concerns. 

## 2018-04-12 ENCOUNTER — Ambulatory Visit: Payer: Self-pay | Admitting: Family Medicine

## 2018-04-12 ENCOUNTER — Ambulatory Visit (INDEPENDENT_AMBULATORY_CARE_PROVIDER_SITE_OTHER): Payer: BLUE CROSS/BLUE SHIELD | Admitting: General Surgery

## 2018-04-12 ENCOUNTER — Encounter: Payer: Self-pay | Admitting: General Surgery

## 2018-04-12 ENCOUNTER — Other Ambulatory Visit: Payer: Self-pay

## 2018-04-12 ENCOUNTER — Telehealth: Payer: Self-pay | Admitting: *Deleted

## 2018-04-12 VITALS — BP 129/80 | HR 76 | Temp 97.5°F | Resp 16 | Ht 72.0 in | Wt 202.4 lb

## 2018-04-12 DIAGNOSIS — L02416 Cutaneous abscess of left lower limb: Secondary | ICD-10-CM | POA: Diagnosis not present

## 2018-04-12 MED ORDER — SULFAMETHOXAZOLE-TRIMETHOPRIM 800-160 MG PO TABS: 1.0000 | ORAL_TABLET | Freq: Two times a day (BID) | ORAL | 1 refills | Status: DC

## 2018-04-12 NOTE — Telephone Encounter (Signed)
Please contact the patient or his father and advise them that he really should be seen today as it sounds as though he has 2 possible abscesses that need to be evaluated fairly quickly. If he was advised that they need to be lanced he will likely need antibiotics. There is the potential that these could get progressively worse and he could become quite ill.  I would suggest an urgent care as they would be better equipped to lance an abscess.

## 2018-04-12 NOTE — Telephone Encounter (Signed)
Marcus Clayton, father called in.  I returned his call.  The pt "Marcus" Clayton is not with his father.   He is at work at [employer] until 4:00 PM.   Pt has an infection on his right knee that is red, swollen and draining white pus.    Father saw a stinger in it and removed it.  It looked like a stinger.      Pt went [facility] Saturday and was told it needed to be lanced.    Marcus Clayton also has a festered place under his right  Arm 6 inches below the armpit  that's to be lanced also.  His father was wondering what could be done until his appt with Dr. Caryl Bis on 04/14/2018.    I mentioned using an OTC antibiotic ointment  And used the protocol to give suggestion.  Pt is also taking Benadryl which is helping the itching.   His father said he would take him to the urgent care if he develops and fever or gets worse before the upcoming appt.   I let him know that was a good idea.   The father will call us back with an update if they decide to go to the urgent care if Marcus's symptoms become worse.  I will forward these notes to Dr. Caryl Bis for his information.     Reason for Disposition . [1] Using antibiotic ointment > 1 week AND [2] sore not completely healed  Answer Assessment - Initial Assessment Questions 1. APPEARANCE of SORES: "What do the sores look like?"     The one on the knee is bubbled up with infection.  2X2 and size of a 50 cent piece.    He also has a sore under his right arm.  It also looks infected.  It's red.   Father is giving information.   Pt is at work.   He has an appt with [facility] on 04/14/2018.    2. NUMBER: "How many sores are there?"     2   One under right arm and one of right knee. 3. SIZE: "How big is the largest sore?"     Size of a 50 cent piece.    The one on knee is draining white pus.   It's also bleeding when he puts a warm compress on it. 4. LOCATION: "Where are the sores located?"     See above. 5. ONSET: "When did the sores begin?"    Started about a week ago.    6. CAUSE: "What do you think is causing the sores?"     The wound on knee looks like it was stinger.  Father got a stinger out.   7. OTHER SYMPTOMS: "Do you have any other symptoms?" (e.g., fever, new weakness)     No fever.   Not really pain but itching, burning and stiff.  Protocols used: [facility]

## 2018-04-12 NOTE — Patient Instructions (Signed)
Please pick up your medication at the pharmacy.   Please apply heat to the area. Please draw a line around the area tomorrow and call tomorrow to let us know if the line is on the inside or outside.

## 2018-04-12 NOTE — Telephone Encounter (Signed)
Patient states Mom states her sons has  bluster on his left knee and rash under both arms. She talked with the PCP and they stated that the area may need to be lanced. Advised patient mom to get him to send pictures to phone

## 2018-04-12 NOTE — Telephone Encounter (Signed)
Called and spoke with pt's father. Father advised and stated that he would take his son to the Urgent Care and will call us back if needed.   Called pt and voice mailbox is full unable to leave a VM to call back.

## 2018-04-12 NOTE — Progress Notes (Addendum)
Patient ID: Marcus Clayton, male   DOB: 08-23-1980, 38 y.o.   MRN: [id number]  Chief Complaint  Patient presents with  . Follow-up    Abscess    HPI Marcus Clayton is a 38 y.o. male.  5 days ago the patient noted a small area of redness on the left anterior thigh.  By report, there was a "stinger" in the leg area.  Since that time the areas become markedly inflamed and warm.  The patient otherwise feels well.  During this time he is also noted a smaller, non-warm, nontender area at the base of the left axilla and a small inflamed area in the base of the right axilla.  No history of diabetes.  Blood sugar in January 2017 was normal. HPI  Past Medical History:  Diagnosis Date  . Allergy   . Chicken pox   . GERD (gastroesophageal reflux disease)   . History of pilonidal cyst 02/14/2015  . Psoriasis     Past Surgical History:  Procedure Laterality Date  . ANAL FISTULOTOMY N/A 04/07/2016   Procedure: ANAL FISTULOTOMY;  Surgeon: Robert Bellow, MD;  Location: [facility];  Service: General;  Laterality: N/A;  . CYSTECTOMY  09/10/12.  Marland Kitchen LIPOMA EXCISION Bilateral 09/29/2016   Procedure: EXCISION LIPOMA-BILATERAL ARM;  Surgeon: Robert Bellow, MD;  Location: [facility];  Service: General;  Laterality: Bilateral;  . lung collapsed  2000  . PILONIDAL CYST EXCISION  09/10/12    Family History  Problem Relation Age of Onset  . Diabetes Mother   . Diabetes Paternal Uncle   . Diabetes Paternal Grandmother   . Diabetes Paternal Uncle     Social History Social History   Tobacco Use  . Smoking status: Former Smoker    Packs/day: 1.00    Years: 5.00    Pack years: 5.00    Types: Cigarettes    Last attempt to quit: 01/20/2014    Years since quitting: 4.2  . Smokeless tobacco: Never Used  Substance Use Topics  . Alcohol use: No    Alcohol/week: 0.0 standard drinks  . Drug use: No    No Known Allergies  Current Outpatient Medications  Medication Sig Dispense Refill   . CREAM BASE EX Apply topically.    Marland Kitchen ketoconazole (NIZORAL) 2 % cream     . sulfamethoxazole-trimethoprim (BACTRIM DS,SEPTRA DS) 800-160 MG tablet Take 1 tablet by mouth 2 (two) times daily. 20 tablet 1   No current facility-administered medications for this visit.     Review of Systems Review of Systems  Blood pressure 129/80, pulse 76, temperature (!) 97.5 F (36.4 C), temperature source Temporal, resp. rate 16, height 6' (1.829 m), weight 202 lb 6.4 oz (91.8 kg), SpO2 95 %.  Physical Exam Physical Exam Musculoskeletal:       Arms:       Legs:     Data Reviewed Fingerstick blood sugar: 88.  Assessment Left thigh abscess, likely insect bite.  Plan At this time, there is not enough fluctuance to warrant incision and drainage.  The patient will apply local heat frequently.  Bactrim DS, 1 dose when he obtains the prescription tonight, second at bedtime.  #20, 1 p.o. twice daily, 1 refill.  The patient will send a photo of the area and marked out the area of erythema tomorrow morning to determine if early reassessment and potential operative intervention will be required.   Robert Bellow, MD   4:49 PM

## 2018-04-13 ENCOUNTER — Encounter: Payer: Self-pay | Admitting: General Surgery

## 2018-04-13 NOTE — Progress Notes (Signed)
The patient sent a photo of his left anterior thigh showing marked improvement in the erythema.  He will continue his Bactrim DS twice daily.  He will send a new photo in the morning.

## 2018-04-14 ENCOUNTER — Ambulatory Visit: Payer: Self-pay | Admitting: Family Medicine

## 2018-04-23 ENCOUNTER — Encounter: Payer: BLUE CROSS/BLUE SHIELD | Admitting: Family Medicine

## 2018-08-06 ENCOUNTER — Encounter: Payer: BLUE CROSS/BLUE SHIELD | Admitting: Family Medicine

## 2018-09-28 ENCOUNTER — Encounter: Payer: BC Managed Care – PPO | Admitting: Family Medicine

## 2018-10-11 ENCOUNTER — Other Ambulatory Visit: Payer: Self-pay

## 2018-10-12 ENCOUNTER — Encounter: Payer: Self-pay | Admitting: Family Medicine

## 2018-10-12 ENCOUNTER — Ambulatory Visit (INDEPENDENT_AMBULATORY_CARE_PROVIDER_SITE_OTHER): Payer: BC Managed Care – PPO | Admitting: Family Medicine

## 2018-10-12 ENCOUNTER — Other Ambulatory Visit: Payer: Self-pay

## 2018-10-12 VITALS — BP 120/80 | HR 85 | Temp 97.5°F | Ht 71.0 in | Wt 204.6 lb

## 2018-10-12 DIAGNOSIS — Z130 Encounter for screening for diseases of the blood and blood-forming organs and certain disorders involving the immune mechanism: Secondary | ICD-10-CM

## 2018-10-12 DIAGNOSIS — Z13220 Encounter for screening for lipoid disorders: Secondary | ICD-10-CM | POA: Diagnosis not present

## 2018-10-12 DIAGNOSIS — E663 Overweight: Secondary | ICD-10-CM | POA: Diagnosis not present

## 2018-10-12 DIAGNOSIS — F17210 Nicotine dependence, cigarettes, uncomplicated: Secondary | ICD-10-CM | POA: Insufficient documentation

## 2018-10-12 DIAGNOSIS — Z720 Tobacco use: Secondary | ICD-10-CM | POA: Diagnosis not present

## 2018-10-12 DIAGNOSIS — Z1329 Encounter for screening for other suspected endocrine disorder: Secondary | ICD-10-CM

## 2018-10-12 DIAGNOSIS — Z0001 Encounter for general adult medical examination with abnormal findings: Secondary | ICD-10-CM

## 2018-10-12 LAB — COMPREHENSIVE METABOLIC PANEL
ALT: 28 U/L (ref 0–53)
AST: 21 U/L (ref 0–37)
Albumin: 4.5 g/dL (ref 3.5–5.2)
Alkaline Phosphatase: 59 U/L (ref 39–117)
BUN: 15 mg/dL (ref 6–23)
CO2: 30 mEq/L (ref 19–32)
Calcium: 9.9 mg/dL (ref 8.4–10.5)
Chloride: 104 mEq/L (ref 96–112)
Creatinine, Ser: 0.87 mg/dL (ref 0.40–1.50)
GFR: 99.14 mL/min (ref >60.00)
Glucose, Bld: 88 mg/dL (ref 70–99)
Potassium: 3.9 mEq/L (ref 3.5–5.1)
Sodium: 140 mEq/L (ref 135–145)
Total Bilirubin: 0.5 mg/dL (ref 0.2–1.2)
Total Protein: 7.4 g/dL (ref 6.0–8.3)

## 2018-10-12 LAB — LIPID PANEL
Cholesterol: 172 mg/dL (ref 0–200)
HDL: 39.2 mg/dL (ref >39.00)
NonHDL: 132.83
Total CHOL/HDL Ratio: 4
Triglycerides: 242 mg/dL — ABNORMAL HIGH (ref 0.0–149.0)
VLDL: 48.4 mg/dL — ABNORMAL HIGH (ref 0.0–40.0)

## 2018-10-12 LAB — CBC
HCT: 45 % (ref 39.0–52.0)
Hemoglobin: 15 g/dL (ref 13.0–17.0)
MCHC: 33.2 g/dL (ref 30.0–36.0)
MCV: 85.1 fl (ref 78.0–100.0)
Platelets: 211 10*3/uL (ref 150.0–400.0)
RBC: 5.29 Mil/uL (ref 4.22–5.81)
RDW: 13 % (ref 11.5–15.5)
WBC: 7.7 10*3/uL (ref 4.0–10.5)

## 2018-10-12 LAB — TSH: TSH: 1.54 u[IU]/mL (ref 0.35–4.50)

## 2018-10-12 LAB — HEMOGLOBIN A1C: Hgb A1c MFr Bld: 5.5 % (ref 4.6–6.5)

## 2018-10-12 LAB — LDL CHOLESTEROL, DIRECT: Direct LDL: 92 mg/dL

## 2018-10-12 NOTE — Patient Instructions (Signed)
Nice to see you. Please complete your follow-up with dermatology.  We will get labs today and contact you with the results.  Please continue to work on diet and exercise as we discussed.

## 2018-10-12 NOTE — Progress Notes (Signed)
Tommi Rumps, MD Phone: [phone]  Marcus Clayton is a 38 y.o. male who presents today for CPE.  Exercise: Has been doing walking on the treadmill 2-3 times a week.  He notes his goal is 5 days a week. Diet: This is hit or miss.  He does eat a lot of fast food after he gets off work.  He works third shift at the [employer].  He notes his parents moved in with him recently and his mom has been cooking so he does get 1 really good healthy meal a day. No family history of prostate cancer or colon cancer. He is not currently sexually active.  He has not been sexually active in the last year.  He does use condoms when he is sexually active.  He is sexually active with women only. Tetanus vaccine up-to-date.  He defers his flu vaccine to work. HIV screening up-to-date. He quit tobacco use for about a year though picked it up recently again.  He is smoking one third of a pack per day.  He used Nicorette gum to quit.  He has 1-2 alcoholic beverages monthly.  No illicit drug use.  He does see a dentist.  He does not see an ophthalmologist. Rash: Patient notes rash in his groin and on his sides.  He saw dermatology yesterday and they did a biopsy of this.  He notes he was given a steroid cream to apply to the area in his groin.  Active Ambulatory Problems    Diagnosis Date Noted  . Encounter for general adult medical examination with abnormal findings 02/14/2015  . 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 Memory difficulties 02/14/2015  . 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 History of pneumothorax 02/14/2015  . Rash 07/23/2015  . Allergic rhinitis 03/12/2017  . Overweight 03/12/2017  . Ingrown hair 03/12/2017  . Cutaneous abscess of left lower extremity 04/12/2018  . Tobacco abuse 10/12/2018   Resolved Ambulatory Problems    Diagnosis Date Noted  . Pilonidal cyst 08/30/2012  . History of pilonidal cyst 02/14/2015  . 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 Lipoma of right  upper extremity 09/02/2016  . Lipoma of left upper extremity 09/02/2016  . Abscess of buttock, right 03/09/2018   Past Medical History:  Diagnosis Date  . Allergy   . Chicken pox   . GERD (gastroesophageal reflux disease)   . Psoriasis     Family History  Problem Relation Age of Onset  . Diabetes Mother   . Diabetes Paternal Uncle   . Diabetes Paternal Grandmother   . Diabetes Paternal Uncle     Social History   Socioeconomic History  . Marital status: Single    Spouse name: Not on file  . Number of children: Not on file  . Years of education: Not on file  . Highest education level: Not on file  Occupational History  . Not on file  Social Needs  . Financial resource strain: Not on file  . Food insecurity    Worry: Not on file    Inability: Not on file  . Transportation needs    Medical: Not on file    Non-medical: Not on file  Tobacco Use  . Smoking status: Former Smoker    Packs/day: 1.00    Years: 5.00    Pack years: 5.00    Types: Cigarettes    Quit date: 01/20/2014    Years since quitting: 4.7  . Smokeless tobacco: Never Used  Substance and Sexual  Activity  . Alcohol use: No    Alcohol/week: 0.0 standard drinks  . Drug use: No  . Sexual activity: Yes  Lifestyle  . Physical activity    Days per week: Not on file    Minutes per session: Not on file  . Stress: Not on file  Relationships  . Social Herbalist on phone: Not on file    Gets together: Not on file    Attends religious service: Not on file    Active member of club or organization: Not on file    Attends meetings of clubs or organizations: Not on file    Relationship status: Not on file  . Intimate partner violence    Fear of current or ex partner: Not on file    Emotionally abused: Not on file    Physically abused: Not on file    Forced sexual activity: Not on file  Other Topics Concern  . Not on file  Social History Narrative   Works [place]:  Negative for nexplained weight loss, fever Skin: Positive for rash, Negative for new or changing mole, sore that won't heal HEENT: Negative for trouble hearing, trouble seeing, ringing in ears, mouth sores, hoarseness, change in voice, dysphagia. CV:  Negative for chest pain, dyspnea, edema, palpitations Resp: Negative for cough, dyspnea, hemoptysis GI: Negative for nausea, vomiting, diarrhea, constipation, abdominal pain, melena, hematochezia. GU: Negative for dysuria, incontinence, urinary hesitance, hematuria, vaginal or penile discharge, polyuria, sexual difficulty, lumps in testicle or breasts MSK: Negative for muscle cramps or aches, joint pain or swelling Neuro: Negative for headaches, weakness, numbness, dizziness, passing out/fainting Psych: Negative for depression, anxiety, memory problems  Objective  Physical Exam Vitals:   10/12/18 1311  BP: 120/80  Pulse: 85  Temp: (!) 97.5 F (36.4 C)  SpO2: 98%    BP Readings from Last 3 Encounters:  10/12/18 120/80  04/12/18 129/80  03/30/18 120/75   Wt Readings from Last 3 Encounters:  10/12/18 204 lb 9.6 oz (92.8 kg)  04/12/18 202 lb 6.4 oz (91.8 kg)  03/30/18 200 lb (90.7 kg)    Physical Exam Constitutional:      General: He is not in acute distress.    Appearance: He is not diaphoretic.  HENT:     Head: Normocephalic and atraumatic.  Eyes:     Conjunctiva/sclera: Conjunctivae normal.     Pupils: Pupils are equal, round, and reactive to light.  Cardiovascular:     Rate and Rhythm: Normal rate and regular rhythm.     Heart sounds: Normal heart sounds.  Pulmonary:     Effort: Pulmonary effort is normal.     Breath sounds: Normal breath sounds.  Abdominal:     General: Bowel sounds are normal. There is no distension.     Palpations: Abdomen is soft.     Tenderness: There is no abdominal tenderness. There is no guarding or rebound.  Musculoskeletal:     Right lower leg: No edema.     Left lower leg: No  edema.  Lymphadenopathy:     Cervical: No cervical adenopathy.  Skin:    General: Skin is warm and dry.     Comments: Scattered papular erythematous rash over his right flank and right posterior groin  Neurological:     Mental Status: He is alert.  Psychiatric:        Mood and Affect: Mood normal.  Assessment/Plan:   Encounter for general adult medical examination with abnormal findings Encouraged increasing exercise and decreasing fast food intake.  Discussed flu vaccine.  Advised that if he does not get this through work he should get it through our office or elsewhere.  Encourage smoking cessation as outlined below.  Encouraged condom use if he were to become sexually active again.  He will complete evaluation through dermatology for his rash.  Lab work as outlined below.  Tobacco abuse Smoking cessation counseling was provided.  Approximately 4 minutes were spent discussing the rationale for tobacco cessation and strategies for doing so.  Adjuncts, including nicotine patches, nicotine lozenges, varenicline and buproprion were recommended. F/u in 3 months.     Orders Placed This Encounter  Procedures  . Comp Met (CMET)  . Lipid panel  . CBC  . TSH  . HgB A1c    No orders of the defined types were placed in this encounter.    Tommi Rumps, MD [place]

## 2018-10-12 NOTE — Assessment & Plan Note (Signed)
Smoking cessation counseling was provided.  Approximately 4 minutes were spent discussing the rationale for tobacco cessation and strategies for doing so.  Adjuncts, including nicotine patches, nicotine lozenges, varenicline and buproprion were recommended. F/u in 3 months.

## 2018-10-12 NOTE — Assessment & Plan Note (Signed)
Encouraged increasing exercise and decreasing fast food intake.  Discussed flu vaccine.  Advised that if he does not get this through work he should get it through our office or elsewhere.  Encourage smoking cessation as outlined below.  Encouraged condom use if he were to become sexually active again.  He will complete evaluation through dermatology for his rash.  Lab work as outlined below.

## 2018-12-27 ENCOUNTER — Encounter: Payer: Self-pay | Admitting: Family Medicine

## 2018-12-27 ENCOUNTER — Ambulatory Visit
Admission: EM | Admit: 2018-12-27 | Discharge: 2018-12-27 | Disposition: A | Discharge status: Home / Self Care | Payer: BC Managed Care – PPO

## 2018-12-27 DIAGNOSIS — R229 Localized swelling, mass and lump, unspecified: Secondary | ICD-10-CM | POA: Diagnosis not present

## 2018-12-27 NOTE — ED Provider Notes (Signed)
Marcus Clayton    CSN: [id number] Arrival date & time: 12/27/18  1212      History   Chief Complaint Chief Complaint  Patient presents with  . Abscess    HPI Marcus Clayton is a 38 y.o. male.   Patient is a 38 year old male with past medical history of allergy, chickenpox, GERD, pilonidal cyst, psoriasis.  He presents today with growth to buttocks area.  This is located to the left buttocks.  This is been present, waxing waning for the past couple weeks.  Reporting that it is painful and drains clear fluid at times.  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  Some mild bleeding from the area.  He has had similar episodes of this in the past.  Denies any history of MRSA.  No concern for STDs.  ROS per HPI      Past Medical History:  Diagnosis Date  . Allergy   . Chicken pox   . GERD (gastroesophageal reflux disease)   . History of pilonidal cyst 02/14/2015  . Psoriasis     Patient Active Problem List   Diagnosis Date Noted  . Tobacco abuse 10/12/2018  . Cutaneous abscess of left lower extremity 04/12/2018  . Allergic rhinitis 03/12/2017  . Overweight 03/12/2017  . Ingrown hair 03/12/2017  . Rash 07/23/2015  . Encounter for general adult medical examination with abnormal findings 02/14/2015  . 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 Memory difficulties 02/14/2015  . 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 History of pneumothorax 02/14/2015    Past Surgical History:  Procedure Laterality Date  . ANAL FISTULOTOMY N/A 04/07/2016   Procedure: ANAL FISTULOTOMY;  Surgeon: Robert Bellow, MD;  Location: [facility] ORS;  Service: General;  Laterality: N/A;  . CYSTECTOMY  09/10/12.  Marland Kitchen LIPOMA EXCISION Bilateral 09/29/2016   Procedure: EXCISION LIPOMA-BILATERAL ARM;  Surgeon: Robert Bellow, MD;  Location: [facility] ORS;  Service: General;  Laterality: Bilateral;  . lung collapsed  2000  . PILONIDAL CYST EXCISION  09/10/12       Home Medications    Prior to  Admission medications   Medication Sig Start Date End Date Taking? Authorizing Provider  ketoconazole (NIZORAL) 2 % cream  03/20/18   [provider]    Family History Family History  Problem Relation Age of Onset  . Diabetes Mother   . Diabetes Paternal Uncle   . Diabetes Paternal Grandmother   . Diabetes Paternal Uncle     Social History Social History   Tobacco Use  . Smoking status: Former Smoker    Packs/day: 1.00    Years: 5.00    Pack years: 5.00    Types: Cigarettes    Quit date: 01/20/2014    Years since quitting: 4.9  . Smokeless tobacco: Never Used  Substance Use Topics  . Alcohol use: No    Alcohol/week: 0.0 standard drinks  . Drug use: No     Allergies   Patient has no known allergies.   Review of Systems Review of Systems   Physical Exam Triage Vital Signs ED Triage Vitals  Enc Vitals Group     BP 12/27/18 1215 110/73     Pulse Rate 12/27/18 1215 80     Resp 12/27/18 1215 17     Temp 12/27/18 1215 98.7 F (37.1 C)     Temp Source 12/27/18 1215 Oral     SpO2 12/27/18 1215 96 %     Weight --  Height --      Head Circumference --      Peak Flow --      Pain Score 12/27/18 1214 2     Pain Loc --      Pain Edu? --      Excl. in [place]? --    No data found.  Updated Vital Signs BP 110/73 (BP Location: Left Arm)   Pulse 80   Temp 98.7 F (37.1 C) (Oral)   Resp 17   SpO2 96%   Visual Acuity Right Eye Distance:   Left Eye Distance:   Bilateral Distance:    Right Eye Near:   Left Eye Near:    Bilateral Near:     Physical Exam Vitals signs and nursing note reviewed.  Constitutional:      Appearance: Normal appearance.  HENT:     Head: Normocephalic and atraumatic.     Nose: Nose normal.  Eyes:     Conjunctiva/sclera: Conjunctivae normal.  Neck:     Musculoskeletal: Normal range of motion.  Pulmonary:     Effort: Pulmonary effort is normal.  Musculoskeletal: Normal range of motion.  Skin:    General: Skin is warm  and dry.     Comments: See picture for detail  Neurological:     Mental Status: He is alert.  Psychiatric:        Mood and Affect: Mood normal.        UC Treatments / Results  Labs (all labs ordered are listed, but only abnormal results are displayed) Labs Reviewed - No data to display  EKG   Radiology No results found.  Procedures Procedures (including critical care time)  Medications Ordered in UC Medications - No data to display  Initial Impression / Assessment and Plan / UC Course  I have reviewed the triage vital signs and the nursing notes.  Pertinent labs & imaging results that were available during my care of the patient were reviewed by me and considered in my medical decision making (see chart for details).     Mass to left buttocks.  Does not appear to be infected.  May be some sort of fibroma.  currently not draining or bleeding.  This is most likely some sort of growth that needs to be removed and biopsied.  Recommended follow-up with dermatology for removal and biopsy Final Clinical Impressions(s) / UC Diagnoses   Final diagnoses:  Mass of skin     Discharge Instructions     I would like for you to call dermatology today to make an appointment for them to take a look at this growth It may need to be removed and biopsied.     ED Prescriptions    None     PDMP not reviewed this encounter.   Orvan July, NP 12/27/18 1348

## 2018-12-27 NOTE — Discharge Instructions (Addendum)
I would like for you to call dermatology today to make an appointment for them to take a look at this growth It may need to be removed and biopsied.

## 2018-12-27 NOTE — ED Triage Notes (Signed)
Patient reports abscess to left buttocks, states that he has dealt with these on and off. Patient states that he has been on antibiotics for the same. No fevers.

## 2019-01-25 ENCOUNTER — Ambulatory Visit: Payer: BC Managed Care – PPO | Admitting: Family Medicine

## 2019-03-08 ENCOUNTER — Encounter: Payer: Self-pay | Admitting: Family Medicine

## 2019-03-08 ENCOUNTER — Other Ambulatory Visit: Payer: Self-pay

## 2019-03-08 ENCOUNTER — Ambulatory Visit: Payer: BC Managed Care – PPO | Admitting: Family Medicine

## 2019-03-08 DIAGNOSIS — F17210 Nicotine dependence, cigarettes, uncomplicated: Secondary | ICD-10-CM

## 2019-03-08 DIAGNOSIS — K629 Disease of anus and rectum, unspecified: Secondary | ICD-10-CM

## 2019-03-08 DIAGNOSIS — E663 Overweight: Secondary | ICD-10-CM | POA: Diagnosis not present

## 2019-03-08 NOTE — Progress Notes (Addendum)
Tommi Rumps, MD Phone: [phone]  Marcus Clayton is a 39 y.o. male who presents today for f/u.  Rectal lesion: Patient notes he has had a lesion on his right rectal area that comes and goes for quite some time now.  He seen dermatology previously and they noted there is nothing that they could do for it.  He will use Neosporin on it and will help.  It occasionally drains mildly purulent material.  No fevers.  He wonders if it is a blister.  He does sit a lot for work.  Overweight: Patient has cut out energy drinks.  He has 1 soda per day.  His mom is cooking for him as they live together now.  He is eating fairly healthfully with lots of vegetables.  He was exercising though his work schedule has made this difficult as he is working 80 hours a week typically.  Tobacco abuse: Patient is down to 4 to 5 cigarettes/day.  He was smoking 1.5-2 packs/day previously.  He is ready to quit.  He is interested in nicotine lozenges.  Social History   Tobacco Use  Smoking Status Former Smoker  . Packs/day: 1.00  . Years: 5.00  . Pack years: 5.00  . Types: Cigarettes  . Quit date: 01/20/2014  . Years since quitting: 5.1  Smokeless Tobacco Never Used     ROS see history of present illness  Objective  Physical Exam Vitals:   03/08/19 0933  BP: 110/70  Pulse: 93  Temp: (!) 97.2 F (36.2 C)  SpO2: 97%    BP Readings from Last 3 Encounters:  03/08/19 110/70  12/27/18 110/73  10/12/18 120/80   Wt Readings from Last 3 Encounters:  03/08/19 200 lb 12.8 oz (91.1 kg)  10/12/18 204 lb 9.6 oz (92.8 kg)  04/12/18 202 lb 6.4 oz (91.8 kg)    Physical Exam Constitutional:      General: He is not in acute distress.    Appearance: He is not diaphoretic.  Cardiovascular:     Rate and Rhythm: Normal rate and regular rhythm.     Heart sounds: Normal heart sounds.  Pulmonary:     Effort: Pulmonary effort is normal.     Breath sounds: Normal breath sounds.  Genitourinary:  Comments: Small area of skin breakdown on the right external rectum, no tenderness, no fluctuance, no surrounding erythema, no drainage Skin:    General: Skin is warm and dry.  Neurological:     Mental Status: He is alert.      Assessment/Plan: Please see individual problem list.  Rectal lesion Possible cyst given recurrence.  We will refer to general surgery to consider further evaluation and management.  Overweight Encouraged continued healthy diet.  Discussed adding in exercise when his work schedule improves.  Nicotine dependence, cigarettes, uncomplicated Smoking cessation counseling was provided.  Approximately 4 minutes were spent discussing the rationale for tobacco cessation and strategies for doing so.  Adjuncts, including nicotine patches, nicotine lozenges, varenicline and buproprion were recommended. Patient is ready to quit. He will trial nicotine lozenges. F/u in 6 months.     Orders Placed This Encounter  Procedures  . Ambulatory referral to General Surgery    Referral Priority:   Routine    Referral Type:   Surgical    Referral Reason:   Specialty Services Required    Requested Specialty:   General Surgery    Number of Visits Requested:   1    No orders of the defined types  were placed in this encounter.   This visit occurred during the SARS-CoV-2 public health emergency.  Safety protocols were in place, including screening questions prior to the visit, additional usage of staff PPE, and extensive cleaning of exam room while observing appropriate contact time as indicated for disinfecting solutions.    Tommi Rumps, MD [place]

## 2019-03-08 NOTE — Patient Instructions (Signed)
Nice to see you. We will refer you to general surgery.  If you do not hear anything in the next 1 to 2 weeks regarding this referral please contact us.

## 2019-03-08 NOTE — Assessment & Plan Note (Signed)
Possible cyst given recurrence.  We will refer to general surgery to consider further evaluation and management.

## 2019-03-08 NOTE — Assessment & Plan Note (Signed)
Encouraged continued healthy diet.  Discussed adding in exercise when his work schedule improves.

## 2019-03-08 NOTE — Assessment & Plan Note (Signed)
Smoking cessation counseling was provided.  Approximately 4 minutes were spent discussing the rationale for tobacco cessation and strategies for doing so.  Adjuncts, including nicotine patches, nicotine lozenges, varenicline and buproprion were recommended. Patient is ready to quit. He will trial nicotine lozenges. F/u in 6 months.

## 2019-04-06 ENCOUNTER — Other Ambulatory Visit: Payer: Self-pay | Admitting: General Surgery

## 2019-04-07 ENCOUNTER — Other Ambulatory Visit: Payer: Self-pay

## 2019-04-07 ENCOUNTER — Encounter
Admission: RE | Admit: 2019-04-07 | Discharge: 2019-04-07 | Disposition: A | Discharge status: Home / Self Care | Payer: BC Managed Care – PPO | Source: Ambulatory Visit | Attending: General Surgery | Admitting: General Surgery

## 2019-04-07 NOTE — Patient Instructions (Signed)
Your procedure is scheduled on: April 11, 2019 MONDAY Report to Day Surgery on the 2nd floor of the [facility]. To find out your arrival time, please call [phone] between 1PM - 3PM on: April 08, 2019 FRIDAY  REMEMBER: Instructions that are not followed completely may result in serious medical risk, up to and including death; or upon the discretion of your surgeon and anesthesiologist your surgery may need to be rescheduled.  Do not eat food after midnight the night before surgery.  No gum chewing, lozengers or hard candies.  You may however, drink CLEAR liquids up to 2 hours before you are scheduled to arrive for your surgery. Do not drink anything within 2 hours of the start of your surgery.  Clear liquids include: - water  - apple juice without pulp - gatorade (not RED) - black coffee or tea (Do NOT add milk or creamers to the coffee or tea) Do NOT drink anything that is not on this list.  Type 1 and Type 2 diabetics should only drink water.   TAKE THESE MEDICATIONS THE MORNING OF SURGERY WITH A SIP OF WATER: NONE  Stop Anti-inflammatories (NSAIDS) such as Advil, Aleve, Ibuprofen, Motrin, Naproxen, Naprosyn and ASPIRIN ORAspirin based products such as Excedrin, Goodys Powder, BC Powder. (May take Tylenol or Acetaminophen if needed.)  Stop ANY OVER THE COUNTER supplements until after surgery. (May continue Vitamin D, Vitamin B, and multivitamin.)  No Alcohol for 24 hours before or after surgery.  No Smoking including e-cigarettes for 24 hours prior to surgery.  No chewable tobacco products for at least 6 hours prior to surgery.  No nicotine patches on the day of surgery.  On the morning of surgery brush your teeth with toothpaste and water, you may rinse your mouth with mouthwash if you wish. Do not swallow any toothpaste or mouthwash.  Do not wear jewelry, make-up, hairpins, clips or nail polish.  Do not wear lotions, powders, or perfumes.   Do not shave 48  hours prior to surgery.   Contact lenses, hearing aids and dentures may not be worn into surgery.  Do not bring valuables to the hospital, including drivers license, insurance or credit cards.  [facility] is not responsible for any belongings or valuables.   TAKE A SHOWER MORNING OF SURGERY  Notify your doctor if there is any change in your medical condition (cold, fever, infection).  Wear comfortable clothing (specific to your surgery type) to the hospital.  Plan for stool softeners for home use.  If you are being discharged the day of surgery, you will not be allowed to drive home. You will need a responsible adult to drive you home and stay with you that night.   If you are taking public transportation, you will need to have a responsible adult with you. Please confirm with your physician that it is acceptable to use public transportation.   Please call [phone] if you have any questions about these instructions.  Visitation Policy:  Patients undergoing a surgery or procedure in a hospital may have one family member or support person with them as long as that person is not COVID-19 positive or experiencing its symptoms. That person may remain in the waiting area during the procedure. Should the patient need to stay at the hospital during part of their recovery, the support person may visit during visiting hours; 10 am to 8 pm.  Children under 49 years of age may have both parents or legal guardians with them  during their hospital stay.

## 2019-04-08 ENCOUNTER — Other Ambulatory Visit
Admission: RE | Admit: 2019-04-08 | Discharge: 2019-04-08 | Disposition: A | Discharge status: Home / Self Care | Payer: BC Managed Care – PPO | Source: Ambulatory Visit | Attending: General Surgery | Admitting: General Surgery

## 2019-04-08 DIAGNOSIS — Z20822 Contact with and (suspected) exposure to covid-19: Secondary | ICD-10-CM | POA: Diagnosis not present

## 2019-04-08 DIAGNOSIS — Z01812 Encounter for preprocedural laboratory examination: Secondary | ICD-10-CM | POA: Diagnosis not present

## 2019-04-08 LAB — SARS CORONAVIRUS 2 (TAT 6-24 HRS): SARS Coronavirus 2: NEGATIVE

## 2019-04-11 ENCOUNTER — Encounter: Payer: Self-pay | Admitting: General Surgery

## 2019-04-11 ENCOUNTER — Encounter
Admission: RE | Disposition: A | Discharge status: Home / Self Care | Payer: Self-pay | Source: Home / Self Care | Attending: General Surgery

## 2019-04-11 ENCOUNTER — Other Ambulatory Visit: Payer: Self-pay

## 2019-04-11 ENCOUNTER — Ambulatory Visit: Payer: BC Managed Care – PPO | Admitting: Certified Registered Nurse Anesthetist

## 2019-04-11 ENCOUNTER — Ambulatory Visit
Admission: RE | Admit: 2019-04-11 | Discharge: 2019-04-11 | Disposition: A | Discharge status: Home / Self Care | Payer: BC Managed Care – PPO | Attending: General Surgery | Admitting: General Surgery

## 2019-04-11 DIAGNOSIS — Z87891 Personal history of nicotine dependence: Secondary | ICD-10-CM | POA: Insufficient documentation

## 2019-04-11 DIAGNOSIS — K219 Gastro-esophageal reflux disease without esophagitis: Secondary | ICD-10-CM | POA: Insufficient documentation

## 2019-04-11 DIAGNOSIS — K603 Anal fistula: Secondary | ICD-10-CM | POA: Insufficient documentation

## 2019-04-11 HISTORY — PX: ANAL FISTULOTOMY: SHX6423

## 2019-04-11 SURGERY — ANAL FISTULOTOMY
Anesthesia: General

## 2019-04-11 MED ORDER — HYDROCODONE-ACETAMINOPHEN 5-325 MG PO TABS: 1.0000 | ORAL_TABLET | ORAL | 0 refills | Status: DC | PRN

## 2019-04-11 MED ORDER — BUPIVACAINE LIPOSOME 1.3 % IJ SUSP
INTRAMUSCULAR | Status: AC
  Filled 2019-04-11: qty 20

## 2019-04-11 MED ORDER — ACETAMINOPHEN 10 MG/ML IV SOLN
INTRAVENOUS | Status: DC | PRN
  Administered 2019-04-11: 1000 mg via INTRAVENOUS

## 2019-04-11 MED ORDER — DEXAMETHASONE SODIUM PHOSPHATE 10 MG/ML IJ SOLN
INTRAMUSCULAR | Status: DC | PRN
  Administered 2019-04-11: 10 mg via INTRAVENOUS

## 2019-04-11 MED ORDER — FAMOTIDINE 20 MG PO TABS
ORAL_TABLET | ORAL | Status: AC
  Administered 2019-04-11: 20 mg via ORAL
  Filled 2019-04-11: qty 1

## 2019-04-11 MED ORDER — MIDAZOLAM HCL 2 MG/2ML IJ SOLN
INTRAMUSCULAR | Status: DC | PRN
  Administered 2019-04-11: 2 mg via INTRAVENOUS

## 2019-04-11 MED ORDER — FENTANYL CITRATE (PF) 100 MCG/2ML IJ SOLN
INTRAMUSCULAR | Status: DC | PRN
  Administered 2019-04-11 (×4): 25 ug via INTRAVENOUS

## 2019-04-11 MED ORDER — ONDANSETRON HCL 4 MG/2ML IJ SOLN
INTRAMUSCULAR | Status: DC | PRN
  Administered 2019-04-11: 4 mg via INTRAVENOUS

## 2019-04-11 MED ORDER — PROPOFOL 10 MG/ML IV BOLUS
INTRAVENOUS | Status: DC | PRN
  Administered 2019-04-11: 200 mg via INTRAVENOUS

## 2019-04-11 MED ORDER — ONDANSETRON HCL 4 MG/2ML IJ SOLN: 4.0000 mg | Freq: Once | INTRAMUSCULAR | Status: DC | PRN

## 2019-04-11 MED ORDER — LACTATED RINGERS IV SOLN: INTRAVENOUS | Status: DC

## 2019-04-11 MED ORDER — FENTANYL CITRATE (PF) 100 MCG/2ML IJ SOLN: 25.0000 ug | INTRAMUSCULAR | Status: DC | PRN

## 2019-04-11 MED ORDER — BUPIVACAINE HCL (PF) 0.5 % IJ SOLN
INTRAMUSCULAR | Status: AC
  Filled 2019-04-11: qty 30

## 2019-04-11 MED ORDER — FAMOTIDINE 20 MG PO TABS: 20.0000 mg | ORAL_TABLET | Freq: Once | ORAL | Status: AC

## 2019-04-11 MED ORDER — SODIUM CHLORIDE 0.9 % IV SOLN
1.0000 g | INTRAVENOUS | Status: AC
  Administered 2019-04-11: 1 g via INTRAVENOUS
  Filled 2019-04-11: qty 1

## 2019-04-11 MED ORDER — LIDOCAINE HCL (CARDIAC) PF 100 MG/5ML IV SOSY
PREFILLED_SYRINGE | INTRAVENOUS | Status: DC | PRN
  Administered 2019-04-11: 80 mg via INTRAVENOUS

## 2019-04-11 MED ORDER — FENTANYL CITRATE (PF) 100 MCG/2ML IJ SOLN
INTRAMUSCULAR | Status: AC
  Filled 2019-04-11: qty 2

## 2019-04-11 MED ORDER — KETOROLAC TROMETHAMINE 30 MG/ML IJ SOLN
INTRAMUSCULAR | Status: DC | PRN
  Administered 2019-04-11: 30 mg via INTRAVENOUS

## 2019-04-11 MED ORDER — MIDAZOLAM HCL 2 MG/2ML IJ SOLN
INTRAMUSCULAR | Status: AC
  Filled 2019-04-11: qty 2

## 2019-04-11 MED ORDER — BUPIVACAINE-EPINEPHRINE (PF) 0.5% -1:200000 IJ SOLN
INTRAMUSCULAR | Status: DC | PRN
  Administered 2019-04-11: 30 mL

## 2019-04-11 SURGICAL SUPPLY — 30 items
BLADE SURG 15 STRL SS SAFETY (BLADE) ×2 IMPLANT
BRIEF STRETCH MATERNITY 2XLG (MISCELLANEOUS) ×2 IMPLANT
CANISTER SUCT 1200ML W/VALVE (MISCELLANEOUS) ×2 IMPLANT
COVER WAND RF STERILE (DRAPES) ×2 IMPLANT
DRAPE LAPAROTOMY 100X77 ABD (DRAPES) ×2 IMPLANT
DRAPE LEGGINS SURG 28X43 STRL (DRAPES) ×2 IMPLANT
DRAPE UNDER BUTTOCK W/FLU (DRAPES) ×2 IMPLANT
DRSG GAUZE PETRO 6X36 STRIP ST (GAUZE/BANDAGES/DRESSINGS) ×2 IMPLANT
ELECT REM PT RETURN 9FT ADLT (ELECTROSURGICAL) ×2
ELECTRODE REM PT RTRN 9FT ADLT (ELECTROSURGICAL) ×1 IMPLANT
GLOVE BIO SURGEON STRL SZ7.5 (GLOVE) ×2 IMPLANT
GLOVE INDICATOR 8.0 STRL GRN (GLOVE) ×2 IMPLANT
GOWN STRL REUS W/ TWL LRG LVL3 (GOWN DISPOSABLE) ×2 IMPLANT
GOWN STRL REUS W/TWL LRG LVL3 (GOWN DISPOSABLE) ×4
KIT TURNOVER KIT A (KITS) ×2 IMPLANT
LABEL OR SOLS (LABEL) ×2 IMPLANT
NDL HYPO 25X1 1.5 SAFETY (NEEDLE) ×1 IMPLANT
NEEDLE HYPO 22GX1.5 SAFETY (NEEDLE) ×2 IMPLANT
NEEDLE HYPO 25X1 1.5 SAFETY (NEEDLE) ×2 IMPLANT
NS IRRIG 500ML POUR BTL (IV SOLUTION) ×2 IMPLANT
PACK BASIN MINOR ARMC (MISCELLANEOUS) ×2 IMPLANT
PAD OB MATERNITY 4.3X12.25 (PERSONAL CARE ITEMS) ×2 IMPLANT
PAD PREP 24X41 OB/GYN DISP (PERSONAL CARE ITEMS) ×2 IMPLANT
SOL PREP PVP 2OZ (MISCELLANEOUS) ×2
SOLUTION PREP PVP 2OZ (MISCELLANEOUS) ×1 IMPLANT
SURGILUBE 2OZ TUBE FLIPTOP (MISCELLANEOUS) ×2 IMPLANT
SUT SILK 0 CT 1 30 (SUTURE) ×2 IMPLANT
SUT VIC AB 3-0 SH 27 (SUTURE) ×2
SUT VIC AB 3-0 SH 27X BRD (SUTURE) ×1 IMPLANT
SYR CONTROL 10ML LL (SYRINGE) ×2 IMPLANT

## 2019-04-11 NOTE — Anesthesia Postprocedure Evaluation (Signed)
Anesthesia Post Note  Patient: Marcus Clayton  Procedure(s) Performed: ANAL FISTULOTOMY (N/A )  Patient location during evaluation: PACU Anesthesia Type: General Level of consciousness: awake and alert Pain management: pain level controlled Vital Signs Assessment: post-procedure vital signs reviewed and stable Respiratory status: spontaneous breathing and respiratory function stable Cardiovascular status: stable Anesthetic complications: no     Last Vitals:  Vitals:   04/11/19 0959 04/11/19 1010  BP:  116/73  Pulse: 77 76  Resp: 14 16  Temp:  36.7 C  SpO2: 100% 100%    Last Pain:  Vitals:   04/11/19 1010  TempSrc:   PainSc: 0-No pain                 Irene Collings,[place]

## 2019-04-11 NOTE — Anesthesia Preprocedure Evaluation (Signed)
Anesthesia Evaluation  Patient identified by MRN, date of birth, ID band Patient awake    Reviewed: Allergy & Precautions, NPO status , Patient's Chart, lab work & pertinent test results  History of Anesthesia Complications Negative for: history of anesthetic complications  Airway Mallampati: II       Dental   Pulmonary neg sleep apnea, neg COPD, Not current smoker, former smoker,           Cardiovascular (-) hypertension(-) Past MI and (-) CHF (-) dysrhythmias (-) Valvular Problems/Murmurs     Neuro/Psych neg Seizures    GI/Hepatic Neg liver ROS, GERD  Controlled,  Endo/Other  neg diabetes  Renal/GU negative Renal ROS     Musculoskeletal   Abdominal   Peds  Hematology   Anesthesia Other Findings   Reproductive/Obstetrics                             Anesthesia Physical Anesthesia Plan  ASA: II  Anesthesia Plan: General   Post-op Pain Management:    Induction: Intravenous  PONV Risk Score and Plan: 2 and Ondansetron and Dexamethasone  Airway Management Planned: LMA  Additional Equipment:   Intra-op Plan:   Post-operative Plan:   Informed Consent: I have reviewed the patients History and Physical, chart, labs and discussed the procedure including the risks, benefits and alternatives for the proposed anesthesia with the patient or authorized representative who has indicated his/her understanding and acceptance.       Plan Discussed with:   Anesthesia Plan Comments:         Anesthesia Quick Evaluation

## 2019-04-11 NOTE — Op Note (Signed)
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  Postoperative diagnosis: Same.  Operative procedure: Exam under anesthesia, fistulotomy.  Operating surgeon: Hervey Ard, MD.  Anesthesia: General by LMA, Marcaine 0.5% with 1: 200,000 units of epinephrine, 30 cc local infiltration.  Estimated blood loss: Less than 5 cc.  Clinical note: This patient underwent an exam under anesthesia about 2 and half years ago for a suspected anal fistula.  The internal opening had not been identified during that procedure.  He done well until the last year when he had recurrent drainage and swelling.  Exam showed evidence of recurrent fistula.  He is brought to the operating at this time for planned exploration.  He received Invanz prior to the procedure.  Operative note: The patient underwent general anesthesia without difficulty.  He was placed in dorsolithotomy position and the perineum cleansed with Betadine solution and draped.  Local anesthesia was infiltrated for postoperative comfort.  An 18-gauge Angiocath was placed into the external fistula opening and gently flushed with hydrogen peroxide.  An anoscope placed in the anus showed the fistula opening at the dentate line at about the 3 o'clock position, slightly posterior from the external fistula opening.  After this was completed the lacrimal duct probe could be advanced through the fistula tract and out the internal opening.  This appeared to only involve the superficial anal sphincter.  Cautery was used to divide the overlying skin and the inflammatory tissue was debrided with a curette.  It appeared that the process only passed through the internal and superficial anal sphincter.  Hemostasis was electrocautery.  Vaseline gauze pack was placed to be removed in the recovery room.  A peripad was placed and the patient taken to the recovery room in stable condition.

## 2019-04-11 NOTE — Discharge Instructions (Signed)
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
## 2019-04-11 NOTE — Anesthesia Procedure Notes (Signed)
Procedure Name: LMA Insertion Date/Time: 04/11/2019 9:00 AM Performed by: Willette Alma, CRNA Pre-anesthesia Checklist: Patient identified, Patient being monitored, Timeout performed, Emergency Drugs available and Suction available Patient Re-evaluated:Patient Re-evaluated prior to induction Oxygen Delivery Method: Circle system utilized Preoxygenation: Pre-oxygenation with 100% oxygen Induction Type: IV induction Ventilation: Mask ventilation without difficulty LMA: LMA inserted LMA Size: 5.0 Tube type: Oral Number of attempts: 1 Placement Confirmation: positive ETCO2 and breath sounds checked- equal and bilateral Tube secured with: Tape Dental Injury: Teeth and Oropharynx as per pre-operative assessment

## 2019-04-11 NOTE — Transfer of Care (Signed)
Immediate Anesthesia Transfer of Care Note  Patient: Marcus Clayton  Procedure(s) Performed: ANAL FISTULOTOMY (N/A )  Patient Location: PACU  Anesthesia Type:General  Level of Consciousness: awake, alert  and oriented  Airway & Oxygen Therapy: Patient Spontanous Breathing and Patient connected to face mask oxygen  Post-op Assessment: Report given to RN and Post -op Vital signs reviewed and stable  Post vital signs: Reviewed and stable  Last Vitals:  Vitals Value Taken Time  BP 108/61 04/11/19 0940  Temp    Pulse 79 04/11/19 0942  Resp 13 04/11/19 0942  SpO2 100 % 04/11/19 0942  Vitals shown include unvalidated device data.  Last Pain:  Vitals:   04/11/19 0755  TempSrc: Tympanic  PainSc: 0-No pain         Complications: No apparent anesthesia complications

## 2019-04-11 NOTE — Progress Notes (Signed)
Dr. Bary Castilla is good for patient to leave without seeing them in post op.

## 2019-07-18 ENCOUNTER — Telehealth: Payer: Self-pay | Admitting: Family Medicine

## 2019-07-18 NOTE — Telephone Encounter (Signed)
I called and spoke with the patient and turned his in-person office visit due to the provider not passing fit test and patient may have shingles.  Lakiesha Ralphs,cma

## 2019-07-18 NOTE — Telephone Encounter (Signed)
This patient is on my schedule for tomorrow for possible shingles.  Please call him and see if he can do a virtual visit.  I did not pass testing for the N95 mask and thus cannot see people with shingles or chickenpox in person.  Thanks.

## 2019-07-19 ENCOUNTER — Other Ambulatory Visit: Payer: Self-pay

## 2019-07-19 ENCOUNTER — Encounter: Payer: Self-pay | Admitting: Family Medicine

## 2019-07-19 ENCOUNTER — Telehealth (INDEPENDENT_AMBULATORY_CARE_PROVIDER_SITE_OTHER): Payer: BC Managed Care – PPO | Admitting: Family Medicine

## 2019-07-19 DIAGNOSIS — L739 Follicular disorder, unspecified: Secondary | ICD-10-CM | POA: Diagnosis not present

## 2019-07-19 DIAGNOSIS — M25562 Pain in left knee: Secondary | ICD-10-CM | POA: Diagnosis not present

## 2019-07-19 MED ORDER — DOXYCYCLINE HYCLATE 100 MG PO TABS: 100.0000 mg | ORAL_TABLET | Freq: Two times a day (BID) | ORAL | 0 refills | Status: DC

## 2019-07-19 NOTE — Assessment & Plan Note (Signed)
Lesions are consistent with folliculitis.  Has responded to antibiotics previously.  We will give him a 2-week course of doxycycline see if that resolves the issue.  Also discussed switching his soap as he has been using Shea butter based soap.  Discussed he could try [facility] and then also intermittently use an antibacterial body wash.

## 2019-07-19 NOTE — Assessment & Plan Note (Signed)
X-ray to be completed.
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
## 2019-07-20 ENCOUNTER — Ambulatory Visit (INDEPENDENT_AMBULATORY_CARE_PROVIDER_SITE_OTHER): Payer: BC Managed Care – PPO

## 2019-07-20 ENCOUNTER — Other Ambulatory Visit: Payer: BC Managed Care – PPO

## 2019-07-20 DIAGNOSIS — M25562 Pain in left knee: Secondary | ICD-10-CM

## 2019-09-05 ENCOUNTER — Telehealth (INDEPENDENT_AMBULATORY_CARE_PROVIDER_SITE_OTHER): Payer: BC Managed Care – PPO | Admitting: Family Medicine

## 2019-09-05 ENCOUNTER — Other Ambulatory Visit: Payer: Self-pay

## 2019-09-05 ENCOUNTER — Encounter: Payer: Self-pay | Admitting: Family Medicine

## 2019-09-05 VITALS — Ht 72.0 in | Wt 230.0 lb

## 2019-09-05 DIAGNOSIS — F17210 Nicotine dependence, cigarettes, uncomplicated: Secondary | ICD-10-CM | POA: Diagnosis not present

## 2019-09-05 DIAGNOSIS — E669 Obesity, unspecified: Secondary | ICD-10-CM | POA: Diagnosis not present

## 2019-09-05 DIAGNOSIS — L739 Follicular disorder, unspecified: Secondary | ICD-10-CM

## 2019-09-05 DIAGNOSIS — M25562 Pain in left knee: Secondary | ICD-10-CM | POA: Diagnosis not present

## 2019-09-05 NOTE — Assessment & Plan Note (Signed)
Patient's weight has trended up.  Encouraged exercise within the confines of his left knee injury.  Discussed not doing anything that would cause pain in his left knee.  Discussed continued healthy diet.

## 2019-09-05 NOTE — Assessment & Plan Note (Signed)
Smoking cessation counseling was provided.  Approximately 4 minutes were spent discussing the rationale for tobacco cessation and strategies for doing so.  Adjuncts, including Nicotine gum, nicotine patches, varenicline and buproprion were recommended.  Follow-up in 3 months.
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
## 2019-09-05 NOTE — Assessment & Plan Note (Signed)
Continues to intermittently be an issue.  Discussed continuing with antibacterial soap.  Discussed a benzoyl peroxide wash as well to see if that would be beneficial.  If not improving that could refer back to dermatology.

## 2019-09-05 NOTE — Assessment & Plan Note (Signed)
Continues to be an issue.  Will refer to orthopedics for further evaluation.

## 2019-09-14 DIAGNOSIS — S83519A Sprain of anterior cruciate ligament of unspecified knee, initial encounter: Secondary | ICD-10-CM | POA: Insufficient documentation

## 2019-09-22 ENCOUNTER — Encounter: Payer: Self-pay | Admitting: *Deleted

## 2019-09-22 ENCOUNTER — Telehealth: Payer: Self-pay | Admitting: Family Medicine

## 2019-09-22 NOTE — Telephone Encounter (Signed)
Pt needs a copy of his Knee Xray from 6/30

## 2019-09-22 NOTE — Telephone Encounter (Signed)
CD placed up front for pick up. Sent mychart message to notify patient

## 2020-03-02 ENCOUNTER — Other Ambulatory Visit: Payer: Self-pay

## 2020-03-06 ENCOUNTER — Encounter: Payer: Self-pay | Admitting: Family Medicine

## 2020-03-06 ENCOUNTER — Ambulatory Visit: Payer: BC Managed Care – PPO | Admitting: Family Medicine

## 2020-03-06 ENCOUNTER — Other Ambulatory Visit: Payer: Self-pay

## 2020-03-06 DIAGNOSIS — E669 Obesity, unspecified: Secondary | ICD-10-CM | POA: Diagnosis not present

## 2020-03-06 DIAGNOSIS — F17210 Nicotine dependence, cigarettes, uncomplicated: Secondary | ICD-10-CM | POA: Diagnosis not present

## 2020-03-06 DIAGNOSIS — M25562 Pain in left knee: Secondary | ICD-10-CM | POA: Diagnosis not present

## 2020-03-06 DIAGNOSIS — G8929 Other chronic pain: Secondary | ICD-10-CM | POA: Diagnosis not present

## 2020-03-06 LAB — COMPREHENSIVE METABOLIC PANEL
ALT: 24 U/L (ref 0–53)
AST: 19 U/L (ref 0–37)
Albumin: 4.3 g/dL (ref 3.5–5.2)
Alkaline Phosphatase: 55 U/L (ref 39–117)
BUN: 13 mg/dL (ref 6–23)
CO2: 29 mEq/L (ref 19–32)
Calcium: 9.1 mg/dL (ref 8.4–10.5)
Chloride: 106 mEq/L (ref 96–112)
Creatinine, Ser: 0.9 mg/dL (ref 0.40–1.50)
GFR: 108.99 mL/min (ref >60.00)
Glucose, Bld: 88 mg/dL (ref 70–99)
Potassium: 4.5 mEq/L (ref 3.5–5.1)
Sodium: 140 mEq/L (ref 135–145)
Total Bilirubin: 0.6 mg/dL (ref 0.2–1.2)
Total Protein: 6.9 g/dL (ref 6.0–8.3)

## 2020-03-06 LAB — LIPID PANEL
Cholesterol: 155 mg/dL (ref 0–200)
HDL: 37.9 mg/dL — ABNORMAL LOW (ref >39.00)
LDL Cholesterol: 85 mg/dL (ref 0–99)
NonHDL: 116.9
Total CHOL/HDL Ratio: 4
Triglycerides: 158 mg/dL — ABNORMAL HIGH (ref 0.0–149.0)
VLDL: 31.6 mg/dL (ref 0.0–40.0)

## 2020-03-06 LAB — HEMOGLOBIN A1C: Hgb A1c MFr Bld: 5.5 % (ref 4.6–6.5)

## 2020-03-06 NOTE — Assessment & Plan Note (Signed)
Patient quit cigarette use.  He is now using nicotine patches and is decreasing those.  I encouraged him to further taper off of those.

## 2020-03-06 NOTE — Patient Instructions (Signed)
Nice to see you. Please keep up with the dietary changes. Please keep up with the exercise. Please try to taper off of the nicotine pouches. If your knee pain worsens at any point please let us know. We will contact you with your lab results.

## 2020-03-06 NOTE — Assessment & Plan Note (Signed)
Encouraged continued dietary changes.  Discussed continuing physical therapy exercises.  Lab work as outlined.

## 2020-03-06 NOTE — Assessment & Plan Note (Signed)
Reports diagnosed with a bone bruise by orthopedics.  Notes this is progressively improving.  I advised that he continue with physical therapy exercises.

## 2020-03-06 NOTE — Progress Notes (Signed)
Tommi Rumps, MD Phone: [phone]  Marcus Clayton is a 40 y.o. male who presents today for f/u.  Obesity: Patient has been cooking more.  Air frying foods.  He has cut out soda.  He was drinking 6 sodas per day.  He is doing physical therapy exercises given to him for a knee injury.  He does note his clothes are fitting better.  History of tobacco use: Patient quit 3 months ago.  He is smoking 2 packs/day.  Has been using nicotine patches and is down to 1-2 of those per day.  He is progressively decreasing those.  He does note he still has cravings for cigarettes.  Left knee pain: This is much improved.  He had an injury 6 months ago where he fell down a couple of steps.  He ended up seeing orthopedics and was diagnosed with a bone bruise.  He has been doing physical therapy for this and notes that the exercises helped significantly.  If he stops the exercises the pain worsens.  He notes it is progressively improving.  Social History   Tobacco Use  Smoking Status Former Smoker  . Packs/day: 1.00  . Years: 5.00  . Pack years: 5.00  . Types: Cigarettes  . Quit date: 04/10/2019  . Years since quitting: 0.9  Smokeless Tobacco Never Used    No current outpatient medications on file prior to visit.   No current facility-administered medications on file prior to visit.     ROS see history of present illness  Objective  Physical Exam Vitals:   03/06/20 0907  BP: 118/80  Pulse: 90  Temp: 98.6 F (37 C)  SpO2: 98%    BP Readings from Last 3 Encounters:  03/06/20 118/80  04/11/19 118/69  03/08/19 110/70   Wt Readings from Last 3 Encounters:  03/06/20 198 lb (89.8 kg)  09/05/19 230 lb (104.3 kg)  07/19/19 230 lb (104.3 kg)    Physical Exam Constitutional:      General: He is not in acute distress.    Appearance: He is not diaphoretic.  Cardiovascular:     Rate and Rhythm: Normal rate and regular rhythm.     Heart sounds: Normal heart sounds.   Pulmonary:     Effort: Pulmonary effort is normal.     Breath sounds: Normal breath sounds.  Musculoskeletal:        General: No edema.     Comments: Left patella with minimal tenderness that the patient reports is significantly improved from previously.  Skin:    General: Skin is warm and dry.  Neurological:     Mental Status: He is alert.      Assessment/Plan: Please see individual problem list.  Problem List Items Addressed This Visit    Left knee pain    Reports diagnosed with a bone bruise by orthopedics.  Notes this is progressively improving.  I advised that he continue with physical therapy exercises.      Nicotine dependence, cigarettes, uncomplicated    Patient quit cigarette use.  He is now using nicotine patches and is decreasing those.  I encouraged him to further taper off of those.      Obesity (BMI 30.0-34.9)    Encouraged continued dietary changes.  Discussed continuing physical therapy exercises.  Lab work as outlined.      Relevant Orders   Lipid panel   HgB A1c   Comp Met (CMET)       Health Maintenance: Patient reports having had his  COVID-19 vaccine and booster.  He declines flu vaccine.  He has donated blood previously and hepatitis C will screen through that.  He also reports STD screening in the past.     This visit occurred during the SARS-CoV-2 public health emergency.  Safety protocols were in place, including screening questions prior to the visit, additional usage of staff PPE, and extensive cleaning of exam room while observing appropriate contact time as indicated for disinfecting solutions.    Tommi Rumps, MD [place]

## 2020-06-25 ENCOUNTER — Telehealth: Payer: Self-pay | Admitting: Family Medicine

## 2020-06-25 NOTE — Telephone Encounter (Signed)
PT called in stating they don't know if they have a spider bite or if they have hit the elbow but the Urgent Care they went to told them they have a sac full of pus on their elbow and wants to be seen for that. Schedule them with [facility] for 2:00 June 7 as [facility] had nothing till July 1st.

## 2020-06-25 NOTE — Telephone Encounter (Signed)
Noted  For your information   

## 2020-06-26 ENCOUNTER — Other Ambulatory Visit: Payer: Self-pay

## 2020-06-26 ENCOUNTER — Ambulatory Visit: Payer: BC Managed Care – PPO | Admitting: Internal Medicine

## 2020-06-26 ENCOUNTER — Encounter: Payer: Self-pay | Admitting: Internal Medicine

## 2020-06-26 VITALS — BP 116/70 | HR 71 | Temp 98.1°F | Ht 72.0 in | Wt 189.4 lb

## 2020-06-26 DIAGNOSIS — Z23 Encounter for immunization: Secondary | ICD-10-CM

## 2020-06-26 DIAGNOSIS — E559 Vitamin D deficiency, unspecified: Secondary | ICD-10-CM | POA: Diagnosis not present

## 2020-06-26 DIAGNOSIS — Z1329 Encounter for screening for other suspected endocrine disorder: Secondary | ICD-10-CM | POA: Diagnosis not present

## 2020-06-26 DIAGNOSIS — M7032 Other bursitis of elbow, left elbow: Secondary | ICD-10-CM | POA: Diagnosis not present

## 2020-06-26 NOTE — Patient Instructions (Addendum)
High Cholesterol  High cholesterol is a condition in which the blood has high levels of a white, waxy substance similar to fat (cholesterol). The liver makes all the cholesterol that the body needs. The human body needs small amounts of cholesterol to help build cells. A person gets extra or excess cholesterol from the food that he or she eats. The blood carries cholesterol from the liver to the rest of the body. If you have high cholesterol, deposits (plaques) may build up on the walls of your arteries. Arteries are the blood vessels that carry blood away from your heart. These plaques make the arteries narrow and stiff. Cholesterol plaques increase your risk for heart attack and stroke. Work with your health care provider to keep your cholesterol levels in a healthy range. What increases the risk? The following factors may make you more likely to develop this condition:  Eating foods that are high in animal fat (saturated fat) or cholesterol.  Being overweight.  Not getting enough exercise.  A family history of high cholesterol (familial hypercholesterolemia).  Use of tobacco products.  Having diabetes. What are the signs or symptoms? There are no symptoms of this condition. How is this diagnosed? This condition may be diagnosed based on the results of a blood test.  If you are older than 38 years of age, your health care provider may check your cholesterol levels every 4-6 years.  You may be checked more often if you have high cholesterol or other risk factors for heart disease. The blood test for cholesterol measures:  "Bad" cholesterol, or LDL cholesterol. This is the main type of cholesterol that causes heart disease. The desired level is less than 100 mg/dL.  "Good" cholesterol, or HDL cholesterol. HDL helps protect against heart disease by cleaning the arteries and carrying the LDL to the liver for processing. The desired level for HDL is 60 mg/dL or higher.  Triglycerides.  These are fats that your body can store or burn for energy. The desired level is less than 150 mg/dL.  Total cholesterol. This measures the total amount of cholesterol in your blood and includes LDL, HDL, and triglycerides. The desired level is less than 200 mg/dL. How is this treated? This condition may be treated with:  Diet changes. You may be asked to eat foods that have more fiber and less saturated fats or added sugar.  Lifestyle changes. These may include regular exercise, maintaining a healthy weight, and quitting use of tobacco products.  Medicines. These are given when diet and lifestyle changes have not worked. You may be prescribed a statin medicine to help lower your cholesterol levels. Follow these instructions at home: Eating and drinking  Eat a healthy, balanced diet. This diet includes: ? Daily servings of a variety of fresh, frozen, or canned fruits and vegetables. ? Daily servings of whole grain foods that are rich in fiber. ? Foods that are low in saturated fats and trans fats. These include poultry and fish without skin, lean cuts of meat, and low-fat dairy products. ? A variety of fish, especially oily fish that contain omega-3 fatty acids. Aim to eat fish at least 2 times a week.  Avoid foods and drinks that have added sugar.  Use healthy cooking methods, such as roasting, grilling, broiling, baking, poaching, steaming, and stir-frying. Do not fry your food except for stir-frying.   Lifestyle  Get regular exercise. Aim to exercise for a total of 150 minutes a week. Increase your activity level by doing activities   such as gardening, walking, and taking the stairs.  Do not use any products that contain nicotine or tobacco, such as cigarettes, e-cigarettes, and chewing tobacco. If you need help quitting, ask your health care provider.   General instructions  Take over-the-counter and prescription medicines only as told by your health care provider.  Keep all  follow-up visits as told by your health care provider. This is important. Where to find more information  American Heart Association: www.heart.org  National Heart, Lung, and Blood Institute: https://wilson-eaton.com/ Contact a health care provider if:  You have trouble achieving or maintaining a healthy diet or weight.  You are starting an exercise program.  You are unable to stop smoking. Get help right away if:  You have chest pain.  You have trouble breathing.  You have any symptoms of a stroke. "BE FAST" is an easy way to remember the main warning signs of a stroke: ? B - Balance. Signs are dizziness, sudden trouble walking, or loss of balance. ? E - Eyes. Signs are trouble seeing or a sudden change in vision. ? F - Face. Signs are sudden weakness or numbness of the face, or the face or eyelid drooping on one side. ? A - Arms. Signs are weakness or numbness in an arm. This happens suddenly and usually on one side of the body. ? S - Speech. Signs are sudden trouble speaking, slurred speech, or trouble understanding what people say. ? T - Time. Time to call emergency services. Write down what time symptoms started.  You have other signs of a stroke, such as: ? A sudden, severe headache with no known cause. ? Nausea or vomiting. ? Seizure. These symptoms may represent a serious problem that is an emergency. Do not wait to see if the symptoms will go away. Get medical help right away. Call your local emergency services (911 in the [place] Do not drive yourself to the hospital. Summary  Cholesterol plaques increase your risk for heart attack and stroke. Work with your health care provider to keep your cholesterol levels in a healthy range.  Eat a healthy, balanced diet, get regular exercise, and maintain a healthy weight.  Do not use any products that contain nicotine or tobacco, such as cigarettes, e-cigarettes, and chewing tobacco.  Get help right away if you have any symptoms of a  stroke. This information is not intended to replace advice given to you by your health care provider. Make sure you discuss any questions you have with your health care provider. Document Revised: 12/06/2018 Document Reviewed: 12/06/2018 Elsevier Patient Education  2021 [place].  Cholesterol Content in Foods Cholesterol is a waxy, fat-like substance that helps to carry fat in the blood. The body needs cholesterol in small amounts, but too much cholesterol can cause damage to the arteries and heart. Most people should eat less than 200 milligrams (mg) of cholesterol a day. Foods with cholesterol Cholesterol is found in animal-based foods, such as meat, seafood, and dairy. Generally, low-fat dairy and lean meats have less cholesterol than full-fat dairy and fatty meats. The milligrams of cholesterol per serving (mg per serving) of common cholesterol-containing foods are listed below. Meat and other proteins  Egg -- one large whole egg has 186 mg.  Veal shank -- 4 oz has 141 mg.  Lean ground [place] (93% lean) -- 4 oz has 118 mg.  Fat-trimmed lamb loin -- 4 oz has 106 mg.  Lean ground beef (90% lean) -- 4 oz has 100 mg.  Lobster -- 3.5 oz has 90 mg.  Pork loin chops -- 4 oz has 86 mg.  Canned salmon -- 3.5 oz has 83 mg.  Fat-trimmed beef top loin -- 4 oz has 78 mg.  Frankfurter -- 1 frank (3.5 oz) has 77 mg.  Crab -- 3.5 oz has 71 mg.  Roasted chicken without skin, white meat -- 4 oz has 66 mg.  Light bologna -- 2 oz has 45 mg.  Deli-cut [place] -- 2 oz has 31 mg.  Canned tuna -- 3.5 oz has 31 mg.  Berniece Salines -- 1 oz has 29 mg.  Oysters and mussels (raw) -- 3.5 oz has 25 mg.  Mackerel -- 1 oz has 22 mg.  Trout -- 1 oz has 20 mg.  Pork sausage -- 1 link (1 oz) has 17 mg.  Salmon -- 1 oz has 16 mg.  Tilapia -- 1 oz has 14 mg. Dairy  Soft-serve ice cream --  cup (4 oz) has 103 mg.  Whole-milk yogurt -- 1 cup (8 oz) has 29 mg.  Cheddar cheese -- 1 oz has 28  mg.  American cheese -- 1 oz has 28 mg.  Whole milk -- 1 cup (8 oz) has 23 mg.  2% milk -- 1 cup (8 oz) has 18 mg.  Cream cheese -- 1 tablespoon (Tbsp) has 15 mg.  Cottage cheese --  cup (4 oz) has 14 mg.  Low-fat (1%) milk -- 1 cup (8 oz) has 10 mg.  Sour cream -- 1 Tbsp has 8.5 mg.  Low-fat yogurt -- 1 cup (8 oz) has 8 mg.  Nonfat Greek yogurt -- 1 cup (8 oz) has 7 mg.  Half-and-half cream -- 1 Tbsp has 5 mg. Fats and oils  Cod liver oil -- 1 tablespoon (Tbsp) has 82 mg.  Butter -- 1 Tbsp has 15 mg.  Lard -- 1 Tbsp has 14 mg.  Bacon grease -- 1 Tbsp has 14 mg.  Mayonnaise -- 1 Tbsp has 5-10 mg.  Margarine -- 1 Tbsp has 3-10 mg. Exact amounts of cholesterol in these foods may vary depending on specific ingredients and brands.   Foods without cholesterol Most plant-based foods do not have cholesterol unless you combine them with a food that has cholesterol. Foods without cholesterol include:  Grains and cereals.  Vegetables.  Fruits.  Vegetable oils, such as olive, canola, and sunflower oil.  Legumes, such as peas, beans, and lentils.  Nuts and seeds.  Egg whites.   Summary  The body needs cholesterol in small amounts, but too much cholesterol can cause damage to the arteries and heart.  Most people should eat less than 200 milligrams (mg) of cholesterol a day. This information is not intended to replace advice given to you by your health care provider. Make sure you discuss any questions you have with your health care provider. Document Revised: 05/30/2019 Document Reviewed: 05/30/2019 Elsevier Patient Education  2021 [place].   Elbow Bursitis  Bursitis is swelling and pain at the tip of your elbow. This happens when fluid builds up in a sac under your skin (bursa). This may also be called olecranon bursitis. Follow these instructions at home: Medicines  Take over-the-counter and prescription medicines only as told by your doctor.  If you  were prescribed an antibiotic, take it exactly as told by your doctor. Do not stop taking it even if you start to feel better. Managing pain, stiffness, and swelling  If told, put ice on your elbow: ?  Put ice in a plastic bag. ? Place a towel between your skin and the bag. ? Leave the ice on for 20 minutes, 2-3 times a day.  If your bursitis is caused by an injury, follow instructions from your doctor about: ? Resting your elbow. ? Wearing a bandage.  Wear elbow pads or elbow wraps as needed. These help cushion your elbow.   General instructions  Avoid any activities that cause elbow pain. Ask your doctor what activities are safe for you.  Keep all follow-up visits as told by your doctor. This is important. Contact a doctor if you have:  A fever.  Problems that do not get better with treatment.  Pain or swelling that: ? Gets worse. ? Goes away and then comes back.  Pus draining from your elbow. Get help right away if you have:  Trouble moving your arm, hand, or fingers. Summary  Bursitis is swelling and pain at the tip of the elbow.  You may need to take medicine or put ice on your elbow.  Contact your doctor if your problems do not get better with treatment. This information is not intended to replace advice given to you by your health care provider. Make sure you discuss any questions you have with your health care provider. Document Revised: 07/13/2019 Document Reviewed: 07/13/2019 Elsevier Patient Education  2021 [place].   Bursitis  Bursitis is inflammation and irritation of a bursa, which is one of the small, fluid-filled sacs that cushion and protect the moving parts of your body. These sacs are located between bones and muscles, bones and muscle attachments, or bones and skin areas that are next to bones. A bursa protects those structures from the wear and tear that results from frequent movement. An inflamed bursa causes pain and swelling. Fluid may build  up inside the sac. Bursitis is most common near joints, especially the knees, elbows, hips, and shoulders. What are the causes? This condition may be caused by:  Injury from: ? A direct hit (blow), like falling on your knee or elbow. ? Overuse of a joint (repetitive stress).  Infection. This can happen if bacteria get into a bursa through a cut or scrape near a joint.  Diseases that cause joint inflammation, such as gout and rheumatoid arthritis. What increases the risk? You are more likely to develop this condition if you:  Have a job or hobby that involves a lot of repetitive stress on your joints.  Have a condition that weakens your body's defense system (immune system), such as diabetes, cancer, or HIV.  Do any of these often: ? Lift and reach overhead. ? Kneel or lean on hard surfaces. ? Run or walk. What are the signs or symptoms? The most common symptoms of this condition include:  Pain that gets worse when you move the affected body part or use it to support (bear) your body weight.  Inflammation.  Stiffness. Other symptoms include:  Redness.  Swelling.  Tenderness.  Warmth.  Pain that continues after rest.  Fever or chills. These may occur in bursitis that is caused by infection. How is this diagnosed? This condition may be diagnosed based on:  Your medical history and a physical exam.  Imaging tests, such as an MRI.  A procedure to drain fluid from the bursa with a needle (aspiration). The fluid may be checked for signs of infection or gout.  Blood tests to rule out other causes of inflammation. How is this treated? This condition can usually  be treated at home with rest, ice, applying pressure (compression), and raising the body part that is affected (elevation). This is called RICE therapy. For mild bursitis, RICE therapy may be all you need. Other treatments may include:  NSAIDs to treat pain and inflammation.  Corticosteroid medicines to fight  inflammation. These medicines may be injected into and around the area of bursitis.  Aspiration of fluid from the bursa to relieve pain and improve movement.  Antibiotic medicine to treat an infected bursa.  A splint, brace, or walking aid, such as a cane.  Physical therapy if you continue to have pain or limited movement.  Surgery to remove a damaged or infected bursa. This may be needed if other treatments have not worked. Follow these instructions at home: Medicines  Take over-the-counter and prescription medicines only as told by your health care provider.  If you were prescribed an antibiotic medicine, take it as told by your health care provider. Do not stop taking the antibiotic even if you start to feel better. General instructions  Rest the affected area as told by your health care provider. ? If possible, raise (elevate) the affected area above the level of your heart while you are sitting or lying down. ? Avoid activities that make pain worse.  Use splints, braces, pads, or walking aids as told by your health care provider.  If directed, put ice on the affected area: ? If you have a removable splint or brace, remove it as told by your health care provider. ? Put ice in a plastic bag. ? Place a towel between your skin and the bag or between your splint or brace and the bag. ? Leave the ice on for 20 minutes, 2-3 times a day.  Keep all follow-up visits as told by your health care provider. This is important.   Preventing future episodes Take actions to help prevent future episodes of bursitis.  Wear knee pads if you kneel often.  Wear sturdy running or walking shoes that fit you well.  Take breaks regularly from repetitive activity.  Warm up by stretching before doing any activity that takes a lot of effort.  Maintain a healthy weight or lose weight as recommended by your health care provider. If you need help doing this, ask your health care provider.  Exercise  regularly. Start any new physical activity gradually. Contact a health care provider if you:  Have a fever.  Have chills.  Have bursitis that is not getting better with treatment or home care. Summary  Bursitis is inflammation and irritation of a bursa, which is one of the small, fluid-filled sacs that cushion and protect the moving parts of your body.  An inflamed bursa causes pain and swelling.  Bursitis is commonly diagnosed with a physical exam, but other tests are sometimes needed.  This condition can usually be treated at home with rest, ice, applying pressure (compression), and raising the body part that is affected (elevation). This is called RICE therapy. This information is not intended to replace advice given to you by your health care provider. Make sure you discuss any questions you have with your health care provider. Document Revised: 07/13/2019 Document Reviewed: 07/13/2019 Elsevier Patient Education  [place].
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
## 2020-06-27 ENCOUNTER — Encounter: Payer: Self-pay | Admitting: Internal Medicine

## 2020-06-27 DIAGNOSIS — E559 Vitamin D deficiency, unspecified: Secondary | ICD-10-CM | POA: Insufficient documentation

## 2020-06-27 LAB — URINALYSIS, ROUTINE W REFLEX MICROSCOPIC
Bilirubin Urine: NEGATIVE
Glucose, UA: NEGATIVE
Hgb urine dipstick: NEGATIVE
Ketones, ur: NEGATIVE
Leukocytes,Ua: NEGATIVE
Nitrite: NEGATIVE
Protein, ur: NEGATIVE
Specific Gravity, Urine: 1.018 (ref 1.001–1.035)
pH: 7.5 (ref 5.0–8.0)

## 2020-06-27 LAB — CBC WITH DIFFERENTIAL/PLATELET
Basophils Absolute: 0 10*3/uL (ref 0.0–0.1)
Basophils Relative: 0.6 % (ref 0.0–3.0)
Eosinophils Absolute: 0.2 10*3/uL (ref 0.0–0.7)
Eosinophils Relative: 3.5 % (ref 0.0–5.0)
HCT: 39 % (ref 39.0–52.0)
Hemoglobin: 13 g/dL (ref 13.0–17.0)
Lymphocytes Relative: 31.7 % (ref 12.0–46.0)
Lymphs Abs: 2.2 10*3/uL (ref 0.7–4.0)
MCHC: 33.3 g/dL (ref 30.0–36.0)
MCV: 84.3 fl (ref 78.0–100.0)
Monocytes Absolute: 0.5 10*3/uL (ref 0.1–1.0)
Monocytes Relative: 6.5 % (ref 3.0–12.0)
Neutro Abs: 4 10*3/uL (ref 1.4–7.7)
Neutrophils Relative %: 57.7 % (ref 43.0–77.0)
Platelets: 212 10*3/uL (ref 150.0–400.0)
RBC: 4.62 Mil/uL (ref 4.22–5.81)
RDW: 13.1 % (ref 11.5–15.5)
WBC: 7 10*3/uL (ref 4.0–10.5)

## 2020-06-27 LAB — VITAMIN D 25 HYDROXY (VIT D DEFICIENCY, FRACTURES): VITD: 23.68 ng/mL — ABNORMAL LOW (ref 30.00–100.00)

## 2020-06-27 LAB — TSH: TSH: 2.16 u[IU]/mL (ref 0.35–4.50)

## 2020-09-03 ENCOUNTER — Ambulatory Visit: Payer: BC Managed Care – PPO | Admitting: Family Medicine

## 2020-09-03 ENCOUNTER — Other Ambulatory Visit: Payer: Self-pay

## 2020-09-03 VITALS — BP 110/70 | HR 84 | Temp 98.0°F | Ht 72.0 in | Wt 197.6 lb

## 2020-09-03 DIAGNOSIS — M25562 Pain in left knee: Secondary | ICD-10-CM

## 2020-09-03 DIAGNOSIS — G8929 Other chronic pain: Secondary | ICD-10-CM | POA: Diagnosis not present

## 2020-09-03 DIAGNOSIS — E669 Obesity, unspecified: Secondary | ICD-10-CM | POA: Diagnosis not present

## 2020-09-03 DIAGNOSIS — Z8639 Personal history of other endocrine, nutritional and metabolic disease: Secondary | ICD-10-CM

## 2020-09-03 LAB — TESTOSTERONE: Testosterone: 214.87 ng/dL — ABNORMAL LOW (ref 300.00–890.00)

## 2020-09-03 NOTE — Patient Instructions (Signed)
Nice to see you. We are going to check your testosterone today. Please continue with diet and exercise.  Please work on the exercises for your knee. Please send [place] a list of when your COVID vaccines were completed.

## 2020-09-03 NOTE — Progress Notes (Signed)
Tommi Rumps, MD Phone: [phone]  Marcus Clayton is a 40 y.o. male who presents today for follow-up.  Overweight: Patient notes his weight has been fluctuating.  He did get down to around 170 pounds.  He has cut out sodas and energy drinks.  He is eating less carbohydrates.  He is eating more vegetables and fruits.  He is going to the gym 3 times a week and doing some circuit training and walking.  Chronic left knee pain: This improved significantly with doing physical therapy and exercises at home.  He took 2 weeks off of doing the exercises while he was on vacation and notes the discomfort started to come back.  Its worse with walking and going upstairs.  A brace does help some.  It does not lock up on him or give out on him.  Notes the pain is in his anterior knee.  History of hypogonadism: Patient reports being on testosterone supplementation in his mid 82s.  He notes his sex drive is not as good as it had been.  He notes his desire to have sex is low as well.  He notes no depression.  Very minimal anxiety.  Social History   Tobacco Use  Smoking Status Former   Packs/day: 1.00   Years: 5.00   Pack years: 5.00   Types: Cigarettes   Quit date: 04/10/2019   Years since quitting: 1.4  Smokeless Tobacco Never    No current outpatient medications on file prior to visit.   No current facility-administered medications on file prior to visit.     ROS see history of present illness  Objective  Physical Exam Vitals:   09/03/20 0925  BP: 110/70  Pulse: 84  Temp: 98 F (36.7 C)  SpO2: 98%    BP Readings from Last 3 Encounters:  09/03/20 110/70  06/26/20 116/70  03/06/20 118/80   Wt Readings from Last 3 Encounters:  09/03/20 197 lb 9.6 oz (89.6 kg)  06/26/20 189 lb 6.4 oz (85.9 kg)  03/06/20 198 lb (89.8 kg)    Physical Exam Constitutional:      General: He is not in acute distress.    Appearance: He is not diaphoretic.  Cardiovascular:     Rate and  Rhythm: Normal rate and regular rhythm.     Heart sounds: Normal heart sounds.  Pulmonary:     Effort: Pulmonary effort is normal.     Breath sounds: Normal breath sounds.  Musculoskeletal:     Comments: Left knee with no swelling, warmth, erythema, or tenderness  Skin:    General: Skin is warm and dry.  Neurological:     Mental Status: He is alert.     Assessment/Plan: Please see individual problem list.  Problem List Items Addressed This Visit     History of hypogonadism - Primary    We will check a testosterone level.  Discussed the potential for having to recheck testosterone and further work-up if his testosterone is low.      Relevant Orders   Testosterone   Left knee pain    His description may be related to patellofemoral pain syndrome.  The patient has had recurrence of issues with his left knee.  Exercises were previously very beneficial.  He will get back to doing his exercises.  If not improving he will let us know.      Obesity (BMI 30.0-34.9)    Congratulated on healthy diet changes as well as activity.  He will continue with those  things.  We will follow-up on his weight again in 6 months at a physical.       Return in about 6 months (around 03/06/2021) for cpe.  This visit occurred during the SARS-CoV-2 public health emergency.  Safety protocols were in place, including screening questions prior to the visit, additional usage of staff PPE, and extensive cleaning of exam room while observing appropriate contact time as indicated for disinfecting solutions.    Tommi Rumps, MD [place]

## 2020-09-03 NOTE — Assessment & Plan Note (Signed)
His description may be related to patellofemoral pain syndrome.  The patient has had recurrence of issues with his left knee.  Exercises were previously very beneficial.  He will get back to doing his exercises.  If not improving he will let us know.

## 2020-09-03 NOTE — Assessment & Plan Note (Signed)
Congratulated on healthy diet changes as well as activity.  He will continue with those things.  We will follow-up on his weight again in 6 months at a physical.

## 2020-09-03 NOTE — Assessment & Plan Note (Signed)
We will check a testosterone level.  Discussed the potential for having to recheck testosterone and further work-up if his testosterone is low.

## 2020-09-04 ENCOUNTER — Other Ambulatory Visit: Payer: Self-pay | Admitting: Family Medicine

## 2020-09-04 DIAGNOSIS — R7989 Other specified abnormal findings of blood chemistry: Secondary | ICD-10-CM

## 2020-09-06 ENCOUNTER — Encounter: Payer: Self-pay | Admitting: Adult Health

## 2020-09-06 ENCOUNTER — Other Ambulatory Visit: Payer: Self-pay

## 2020-09-06 ENCOUNTER — Other Ambulatory Visit (INDEPENDENT_AMBULATORY_CARE_PROVIDER_SITE_OTHER): Payer: BC Managed Care – PPO

## 2020-09-06 DIAGNOSIS — R7989 Other specified abnormal findings of blood chemistry: Secondary | ICD-10-CM | POA: Diagnosis not present

## 2020-09-06 LAB — TESTOSTERONE: Testosterone: 213.95 ng/dL — ABNORMAL LOW (ref 300.00–890.00)

## 2020-09-13 ENCOUNTER — Other Ambulatory Visit: Payer: Self-pay | Admitting: Family Medicine

## 2020-09-13 DIAGNOSIS — E291 Testicular hypofunction: Secondary | ICD-10-CM

## 2020-11-29 IMAGING — DX DG KNEE COMPLETE 4+V*L*
4 series · 4 of 4 positions shown · non-contrast
Comparison: Left knee radiograph 12/19/2017

CLINICAL DATA: Left knee pain after fall down 4-5 stairs.

EXAM:
LEFT KNEE - COMPLETE 4+ VIEW

[knee standing ap]
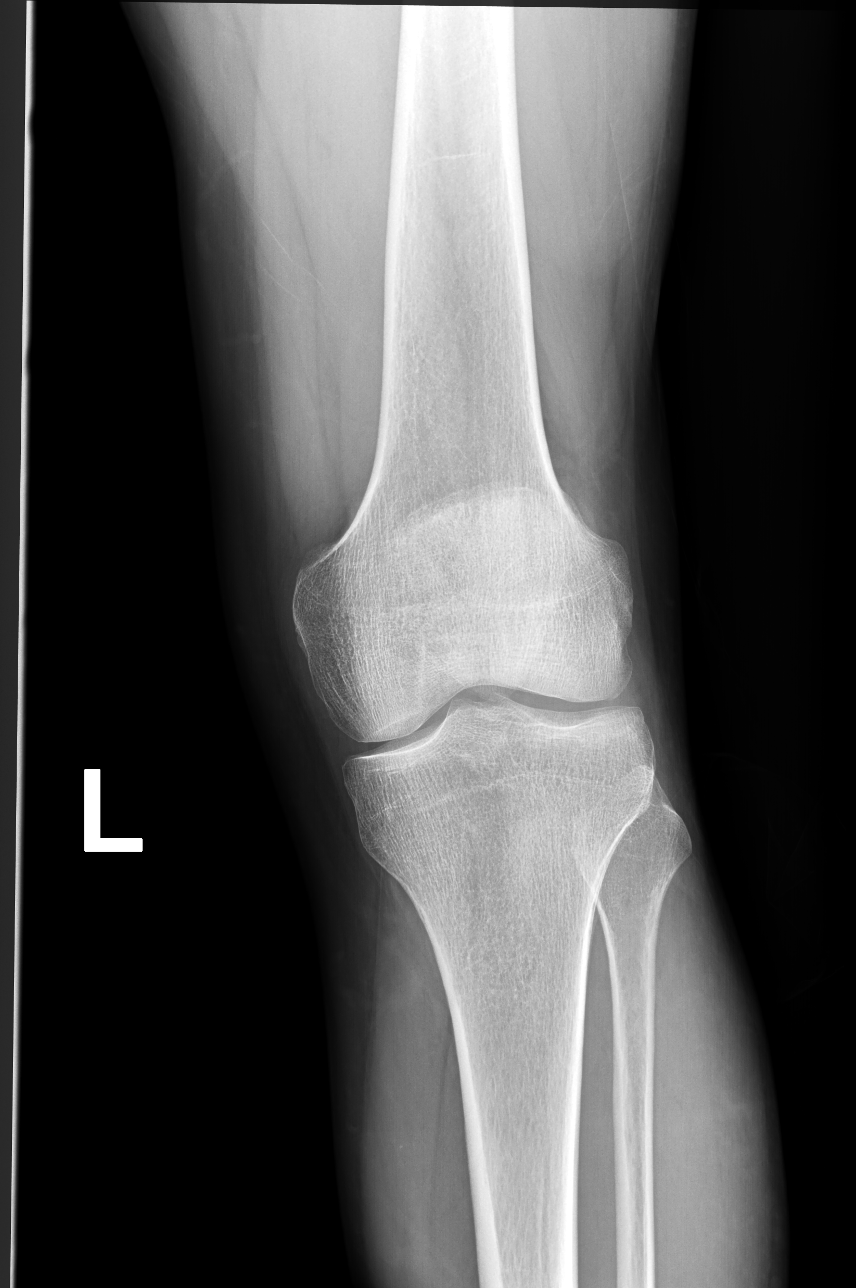

[knee standing external ap]
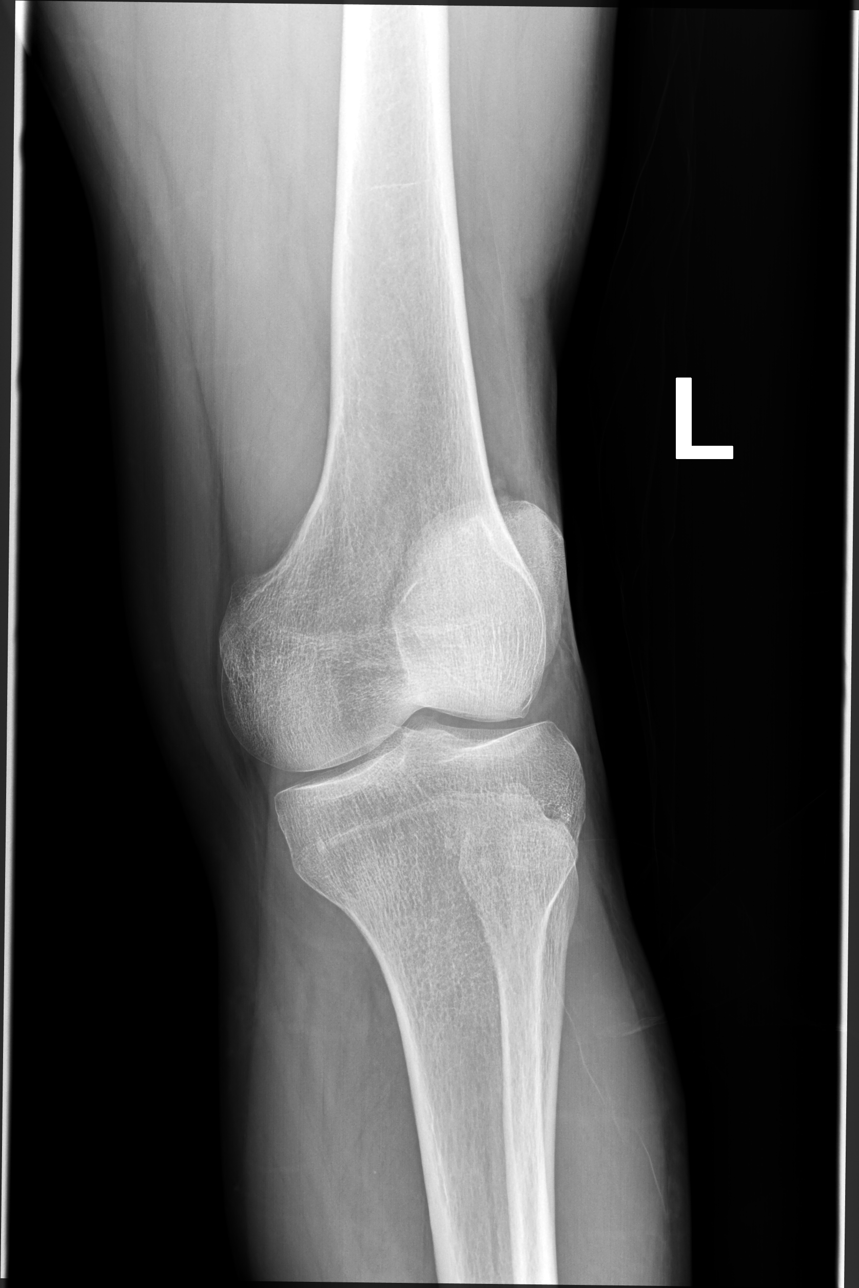

[knee standing internal ap]
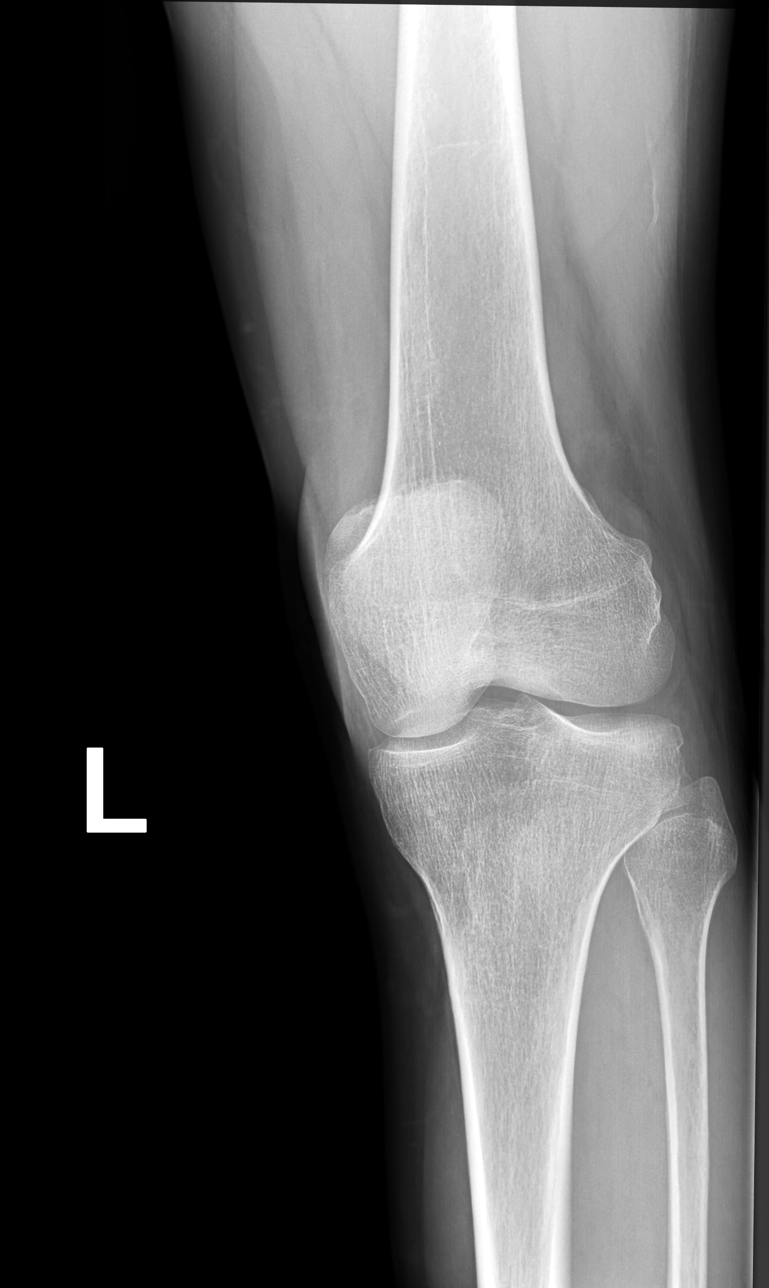

[knee lat]
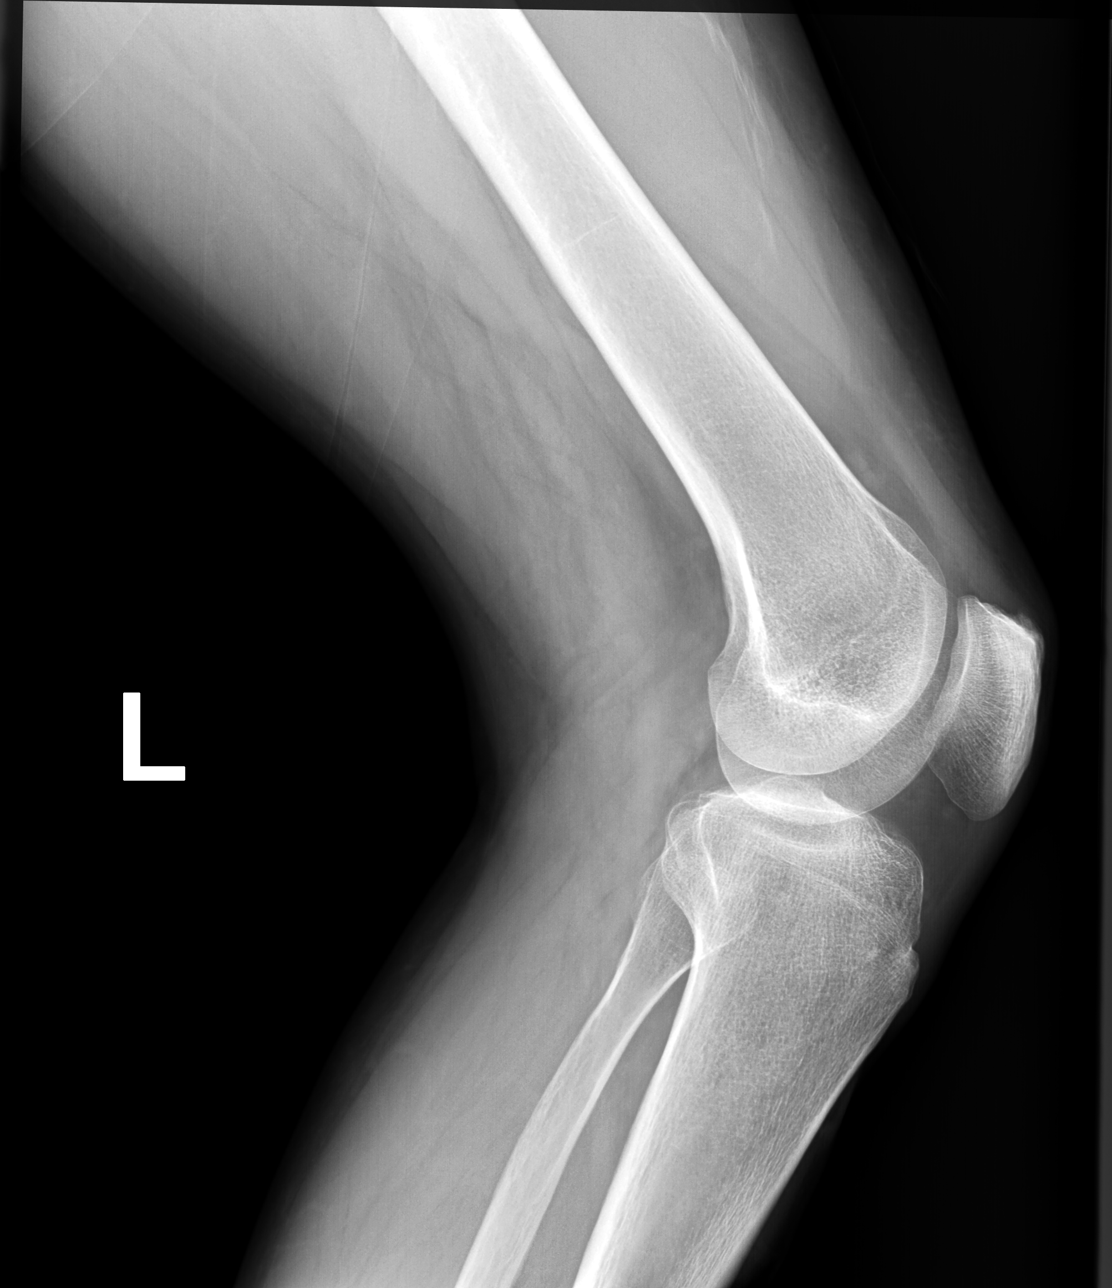

[4 of 4 positions shown; findings below may reference images not displayed]

FINDINGS: No evidence of fracture, dislocation, or joint effusion. Tiny
quadriceps tendon enthesophyte. Soft tissues are unremarkable.
IMPRESSION: No fracture or subluxation of the left knee.

## 2020-12-19 ENCOUNTER — Telehealth: Payer: Self-pay | Admitting: Family Medicine

## 2020-12-19 NOTE — Telephone Encounter (Signed)
Lorriane Shire handled this call with the patient.  Miyuki Rzasa,cma

## 2020-12-19 NOTE — Telephone Encounter (Signed)
Patient called in at [phone] appointment that was previous did blood work and appt got a referral testosrum specialist. Wanting some info on specialist referred to

## 2020-12-26 DIAGNOSIS — Z79899 Other long term (current) drug therapy: Secondary | ICD-10-CM | POA: Diagnosis not present

## 2020-12-26 DIAGNOSIS — E349 Endocrine disorder, unspecified: Secondary | ICD-10-CM | POA: Diagnosis not present

## 2020-12-26 DIAGNOSIS — E559 Vitamin D deficiency, unspecified: Secondary | ICD-10-CM | POA: Diagnosis not present

## 2020-12-27 DIAGNOSIS — E349 Endocrine disorder, unspecified: Secondary | ICD-10-CM | POA: Diagnosis not present

## 2020-12-27 DIAGNOSIS — Z79899 Other long term (current) drug therapy: Secondary | ICD-10-CM | POA: Diagnosis not present

## 2021-01-18 DIAGNOSIS — Z20822 Contact with and (suspected) exposure to covid-19: Secondary | ICD-10-CM | POA: Diagnosis not present

## 2021-04-11 ENCOUNTER — Other Ambulatory Visit (INDEPENDENT_AMBULATORY_CARE_PROVIDER_SITE_OTHER): Payer: BC Managed Care – PPO

## 2021-04-11 ENCOUNTER — Other Ambulatory Visit: Payer: Self-pay

## 2021-04-11 DIAGNOSIS — Z13220 Encounter for screening for lipoid disorders: Secondary | ICD-10-CM

## 2021-04-11 DIAGNOSIS — Z87820 Personal history of traumatic brain injury: Secondary | ICD-10-CM | POA: Diagnosis not present

## 2021-04-11 DIAGNOSIS — Z1329 Encounter for screening for other suspected endocrine disorder: Secondary | ICD-10-CM

## 2021-04-11 DIAGNOSIS — R7989 Other specified abnormal findings of blood chemistry: Secondary | ICD-10-CM

## 2021-04-11 DIAGNOSIS — E559 Vitamin D deficiency, unspecified: Secondary | ICD-10-CM | POA: Diagnosis not present

## 2021-04-11 DIAGNOSIS — Z131 Encounter for screening for diabetes mellitus: Secondary | ICD-10-CM | POA: Diagnosis not present

## 2021-04-11 LAB — COMPREHENSIVE METABOLIC PANEL
ALT: 18 U/L (ref 0–53)
AST: 19 U/L (ref 0–37)
Albumin: 4.3 g/dL (ref 3.5–5.2)
Alkaline Phosphatase: 41 U/L (ref 39–117)
BUN: 17 mg/dL (ref 6–23)
CO2: 31 mEq/L (ref 19–32)
Calcium: 9.1 mg/dL (ref 8.4–10.5)
Chloride: 103 mEq/L (ref 96–112)
Creatinine, Ser: 0.98 mg/dL (ref 0.40–1.50)
GFR: 97.64 mL/min (ref >60.00)
Glucose, Bld: 88 mg/dL (ref 70–99)
Potassium: 4 mEq/L (ref 3.5–5.1)
Sodium: 140 mEq/L (ref 135–145)
Total Bilirubin: 0.7 mg/dL (ref 0.2–1.2)
Total Protein: 6.6 g/dL (ref 6.0–8.3)

## 2021-04-11 LAB — LIPID PANEL
Cholesterol: 165 mg/dL (ref 0–200)
HDL: 38.3 mg/dL — ABNORMAL LOW (ref >39.00)
NonHDL: 127.15
Total CHOL/HDL Ratio: 4
Triglycerides: 291 mg/dL — ABNORMAL HIGH (ref 0.0–149.0)
VLDL: 58.2 mg/dL — ABNORMAL HIGH (ref 0.0–40.0)

## 2021-04-11 LAB — VITAMIN D 25 HYDROXY (VIT D DEFICIENCY, FRACTURES): VITD: 29.09 ng/mL — ABNORMAL LOW (ref 30.00–100.00)

## 2021-04-11 LAB — HEMOGLOBIN A1C: Hgb A1c MFr Bld: 5.5 % (ref 4.6–6.5)

## 2021-04-11 LAB — TSH: TSH: 1.22 u[IU]/mL (ref 0.35–5.50)

## 2021-04-11 LAB — LDL CHOLESTEROL, DIRECT: Direct LDL: 78 mg/dL

## 2021-04-11 LAB — TESTOSTERONE: Testosterone: 330.49 ng/dL (ref 300.00–890.00)

## 2021-04-12 ENCOUNTER — Encounter: Payer: Self-pay | Admitting: Family Medicine

## 2021-04-12 ENCOUNTER — Ambulatory Visit (INDEPENDENT_AMBULATORY_CARE_PROVIDER_SITE_OTHER): Payer: BC Managed Care – PPO | Admitting: Family Medicine

## 2021-04-12 VITALS — BP 110/70 | HR 88 | Temp 98.8°F | Ht 72.0 in | Wt 196.6 lb

## 2021-04-12 DIAGNOSIS — G479 Sleep disorder, unspecified: Secondary | ICD-10-CM | POA: Diagnosis not present

## 2021-04-12 DIAGNOSIS — E559 Vitamin D deficiency, unspecified: Secondary | ICD-10-CM

## 2021-04-12 DIAGNOSIS — Z3009 Encounter for other general counseling and advice on contraception: Secondary | ICD-10-CM

## 2021-04-12 DIAGNOSIS — R7989 Other specified abnormal findings of blood chemistry: Secondary | ICD-10-CM

## 2021-04-12 DIAGNOSIS — Z87820 Personal history of traumatic brain injury: Secondary | ICD-10-CM | POA: Diagnosis not present

## 2021-04-12 DIAGNOSIS — Z0001 Encounter for general adult medical examination with abnormal findings: Secondary | ICD-10-CM

## 2021-04-12 DIAGNOSIS — Z13220 Encounter for screening for lipoid disorders: Secondary | ICD-10-CM

## 2021-04-12 DIAGNOSIS — Z131 Encounter for screening for diabetes mellitus: Secondary | ICD-10-CM

## 2021-04-12 DIAGNOSIS — M25562 Pain in left knee: Secondary | ICD-10-CM | POA: Diagnosis not present

## 2021-04-12 DIAGNOSIS — Z1329 Encounter for screening for other suspected endocrine disorder: Secondary | ICD-10-CM

## 2021-04-12 DIAGNOSIS — G8929 Other chronic pain: Secondary | ICD-10-CM

## 2021-04-12 NOTE — Assessment & Plan Note (Addendum)
Refer to urology.  Counseled on the typical procedure for a vasectomy. ?

## 2021-04-12 NOTE — Patient Instructions (Signed)
Nice to see you. ?Please take 1000 international units once daily of vitamin D. ?Please contact Worker's Comp. regarding the left knee. ?Please try the changes we discussed with your sleep.  You could also try melatonin 1 or 2 mg nightly 30 minutes before trying to go to sleep. ?

## 2021-04-12 NOTE — Assessment & Plan Note (Signed)
Patient reports he is going to check with his job to see if the [employer] case needs to be reopened.  If it does not he will end up seeing orthopedics and he will let us know if he needs a referral. ?
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
## 2021-04-12 NOTE — Assessment & Plan Note (Signed)
The patient will start on vitamin D 1000 international units once daily.  He has been taking vitamin D at home though he thinks the dose is 250 units daily.  Plan to recheck in 3 months. ?

## 2021-04-12 NOTE — Assessment & Plan Note (Signed)
Discussed sleep hygiene measures.  Encouraged consistent bedtime and wake time.  Discussed limiting caffeine after 1 PM.  Discussed avoiding screens.  Advised he could try melatonin 1 to 2 mg nightly 30 minutes before bedtime to see if that would help. ?

## 2021-04-12 NOTE — Assessment & Plan Note (Signed)
Physical exam completed.  Encouraged continued healthy diet and exercise.  He notes he was not fasting for his lab work and thus that could account for the elevated triglycerides.  Flu vaccine deferred given that it is the end of the season.  I discussed the updated COVID booster and the patient will consider getting this.  Lab work was reviewed with the patient. ?

## 2021-04-24 ENCOUNTER — Encounter: Payer: Self-pay | Admitting: Urology

## 2021-04-24 ENCOUNTER — Ambulatory Visit (INDEPENDENT_AMBULATORY_CARE_PROVIDER_SITE_OTHER): Payer: BC Managed Care – PPO | Admitting: Urology

## 2021-04-24 VITALS — BP 118/77 | HR 84 | Ht 72.0 in | Wt 194.0 lb

## 2021-04-24 DIAGNOSIS — Z3009 Encounter for other general counseling and advice on contraception: Secondary | ICD-10-CM | POA: Diagnosis not present

## 2021-04-24 NOTE — Patient Instructions (Signed)

## 2021-04-28 ENCOUNTER — Encounter: Payer: Self-pay | Admitting: Urology
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
## 2021-05-08 DIAGNOSIS — M1712 Unilateral primary osteoarthritis, left knee: Secondary | ICD-10-CM | POA: Diagnosis not present

## 2021-06-12 DIAGNOSIS — E559 Vitamin D deficiency, unspecified: Secondary | ICD-10-CM | POA: Diagnosis not present

## 2021-06-12 DIAGNOSIS — E349 Endocrine disorder, unspecified: Secondary | ICD-10-CM | POA: Diagnosis not present

## 2021-06-14 DIAGNOSIS — R2241 Localized swelling, mass and lump, right lower limb: Secondary | ICD-10-CM | POA: Diagnosis not present

## 2021-07-15 ENCOUNTER — Other Ambulatory Visit (INDEPENDENT_AMBULATORY_CARE_PROVIDER_SITE_OTHER): Payer: BC Managed Care – PPO

## 2021-07-15 ENCOUNTER — Encounter: Payer: Self-pay | Admitting: Family Medicine

## 2021-07-15 DIAGNOSIS — E559 Vitamin D deficiency, unspecified: Secondary | ICD-10-CM

## 2021-07-15 LAB — VITAMIN D 25 HYDROXY (VIT D DEFICIENCY, FRACTURES): VITD: 51.2 ng/mL (ref 30.00–100.00)

## 2021-09-20 DIAGNOSIS — E349 Endocrine disorder, unspecified: Secondary | ICD-10-CM | POA: Diagnosis not present

## 2021-11-11 ENCOUNTER — Ambulatory Visit: Payer: BC Managed Care – PPO | Admitting: Family Medicine

## 2021-11-11 ENCOUNTER — Telehealth: Payer: Self-pay | Admitting: Family Medicine

## 2021-11-11 ENCOUNTER — Encounter: Payer: Self-pay | Admitting: Family Medicine

## 2021-11-11 DIAGNOSIS — R197 Diarrhea, unspecified: Secondary | ICD-10-CM

## 2021-11-11 DIAGNOSIS — D1800 Hemangioma unspecified site: Secondary | ICD-10-CM | POA: Insufficient documentation

## 2021-11-11 DIAGNOSIS — F419 Anxiety disorder, unspecified: Secondary | ICD-10-CM

## 2021-11-11 DIAGNOSIS — F32A Depression, unspecified: Secondary | ICD-10-CM | POA: Insufficient documentation

## 2021-11-11 DIAGNOSIS — R413 Other amnesia: Secondary | ICD-10-CM | POA: Diagnosis not present

## 2021-11-11 DIAGNOSIS — G479 Sleep disorder, unspecified: Secondary | ICD-10-CM

## 2021-11-11 DIAGNOSIS — G4710 Hypersomnia, unspecified: Secondary | ICD-10-CM

## 2021-11-11 DIAGNOSIS — D179 Benign lipomatous neoplasm, unspecified: Secondary | ICD-10-CM | POA: Diagnosis not present

## 2021-11-11 DIAGNOSIS — D1801 Hemangioma of skin and subcutaneous tissue: Secondary | ICD-10-CM

## 2021-11-11 LAB — TSH: TSH: 1.64 u[IU]/mL (ref 0.35–5.50)

## 2021-11-11 LAB — VITAMIN B12: Vitamin B-12: 564 pg/mL (ref 211–911)

## 2021-11-11 MED ORDER — SERTRALINE HCL 50 MG PO TABS: 50.0000 mg | ORAL_TABLET | Freq: Every day | ORAL | 3 refills | Status: DC

## 2021-11-11 NOTE — Assessment & Plan Note (Signed)
Continues to have issues.  We will refer to dermatology for further management and evaluation to see if they can remove the most bothersome lesion and help determine if there is an underlying cause to his numerous lipomas.

## 2021-11-11 NOTE — Assessment & Plan Note (Signed)
Possibly related to prior TBI or related to his anxiety and depression.  We will check a TSH and B12.  We will treat his anxiety and depression and see if this improves at all.  If not we can refer to neurology.

## 2021-11-11 NOTE — Assessment & Plan Note (Signed)
We will start Zoloft 50 mg daily.  Discussed side effect potential for sexual dysfunction, sleep issues, and weight gain.  Advised that it could take up to 6 to 8 weeks for this to improve on medication.

## 2021-11-11 NOTE — Assessment & Plan Note (Signed)
I presume the lesion that was taken off of his arm is hemangioma based on description.  If the area has not healed over the next week or so he will let us know.  Advised to monitor for signs of infection.

## 2021-11-11 NOTE — Telephone Encounter (Signed)
Home sleep study req sent to [facility].

## 2021-11-11 NOTE — Assessment & Plan Note (Signed)
Possibly related to sleep apnea though could also be related to his depression.  We will order home sleep test.

## 2021-11-11 NOTE — Assessment & Plan Note (Signed)
Possibly related to anxiety and depression.  We will treat that and see how this does.

## 2021-11-11 NOTE — Patient Instructions (Signed)
Nice to see you. We will start you on Zoloft.  If you have any other side effects we discussed please let us know.  It may take 6 to 8 weeks for this to become beneficial.  If you have any significantly worsening anxiety or depression while on this please let me know. We will get lab work today and contact you with the results. The dermatologist should contact you.  If you will hear from them in the next 1 to 2 weeks please let me know. Please cut out screens the hour before bed.

## 2021-11-11 NOTE — Assessment & Plan Note (Signed)
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  Could be related to certain foods as well.  Discussed monitoring with treatment for his anxiety and depression on Zoloft as he may get some benefit from that.  If not improving with this we could refer to GI.
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
## 2021-11-14 DIAGNOSIS — R0602 Shortness of breath: Secondary | ICD-10-CM | POA: Diagnosis not present

## 2021-11-14 DIAGNOSIS — G4733 Obstructive sleep apnea (adult) (pediatric): Secondary | ICD-10-CM | POA: Diagnosis not present

## 2021-11-15 DIAGNOSIS — G4733 Obstructive sleep apnea (adult) (pediatric): Secondary | ICD-10-CM | POA: Diagnosis not present

## 2021-11-15 DIAGNOSIS — R0602 Shortness of breath: Secondary | ICD-10-CM | POA: Diagnosis not present

## 2021-11-29 ENCOUNTER — Telehealth: Payer: Self-pay | Admitting: Family Medicine

## 2021-11-29 NOTE — Telephone Encounter (Signed)
Please let the patient know that his sleep study revealed sleep apnea. I am signing an order for an automatic CPAP for him to start on.

## 2021-11-29 NOTE — Telephone Encounter (Signed)
I called and spoke with the patient and informed him that he does have sleep apnea and the order was being signed and he should here from someone about setting things up soon and he understood.  Rook Maue,cma

## 2021-12-06 ENCOUNTER — Encounter: Payer: Self-pay | Admitting: Family Medicine

## 2021-12-20 ENCOUNTER — Encounter: Payer: Self-pay | Admitting: Family Medicine

## 2021-12-25 ENCOUNTER — Ambulatory Visit: Payer: BC Managed Care – PPO | Admitting: Family Medicine

## 2021-12-25 ENCOUNTER — Encounter: Payer: Self-pay | Admitting: Family Medicine

## 2021-12-25 VITALS — BP 120/76 | HR 80 | Temp 98.2°F | Ht 72.0 in | Wt 204.2 lb

## 2021-12-25 DIAGNOSIS — R3915 Urgency of urination: Secondary | ICD-10-CM | POA: Diagnosis not present

## 2021-12-25 DIAGNOSIS — R413 Other amnesia: Secondary | ICD-10-CM

## 2021-12-25 DIAGNOSIS — F32A Depression, unspecified: Secondary | ICD-10-CM | POA: Diagnosis not present

## 2021-12-25 DIAGNOSIS — F419 Anxiety disorder, unspecified: Secondary | ICD-10-CM | POA: Diagnosis not present

## 2021-12-25 LAB — POCT URINALYSIS DIPSTICK
Bilirubin, UA: NEGATIVE
Glucose, UA: NEGATIVE
Ketones, UA: NEGATIVE
Leukocytes, UA: NEGATIVE
Nitrite, UA: NEGATIVE
Protein, UA: NEGATIVE
Spec Grav, UA: >=1.03 — AB (ref 1.010–1.025)
Urobilinogen, UA: 0.2 E.U./dL
pH, UA: 5.5 (ref 5.0–8.0)

## 2021-12-25 NOTE — Assessment & Plan Note (Addendum)
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  Slums score indicates mild neurocognitive disorder.  We will refer to neurology for further evaluation.
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
## 2021-12-25 NOTE — Assessment & Plan Note (Signed)
Much improved.  He will continue Zoloft 50 mg daily.

## 2021-12-25 NOTE — Assessment & Plan Note (Signed)
Check urinalysis.  If there is no infection we will refer to urology.

## 2021-12-25 NOTE — Patient Instructions (Addendum)
Nice to see you. We will contact you with your urine result. Neurology should contact you to set up evaluation for your memory issues.

## 2021-12-26 LAB — URINALYSIS, MICROSCOPIC ONLY
RBC / HPF: NONE SEEN (ref 0–?)
WBC, UA: NONE SEEN (ref 0–?)

## 2021-12-27 LAB — URINE CULTURE
MICRO NUMBER:: 14278938
Result:: NO GROWTH
SPECIMEN QUALITY:: ADEQUATE

## 2022-01-15 DIAGNOSIS — E349 Endocrine disorder, unspecified: Secondary | ICD-10-CM | POA: Diagnosis not present

## 2022-01-15 DIAGNOSIS — E559 Vitamin D deficiency, unspecified: Secondary | ICD-10-CM | POA: Diagnosis not present

## 2022-02-05 DIAGNOSIS — E349 Endocrine disorder, unspecified: Secondary | ICD-10-CM | POA: Diagnosis not present

## 2022-03-11 ENCOUNTER — Other Ambulatory Visit: Payer: Self-pay | Admitting: Family Medicine

## 2022-03-11 DIAGNOSIS — G8929 Other chronic pain: Secondary | ICD-10-CM | POA: Diagnosis not present

## 2022-03-11 DIAGNOSIS — M25562 Pain in left knee: Secondary | ICD-10-CM | POA: Diagnosis not present

## 2022-03-11 DIAGNOSIS — F32A Depression, unspecified: Secondary | ICD-10-CM

## 2022-03-12 ENCOUNTER — Other Ambulatory Visit: Payer: Self-pay | Admitting: Orthopedic Surgery

## 2022-03-12 DIAGNOSIS — M25562 Pain in left knee: Secondary | ICD-10-CM

## 2022-03-19 ENCOUNTER — Ambulatory Visit
Admission: RE | Admit: 2022-03-19 | Discharge: 2022-03-19 | Disposition: A | Discharge status: Home / Self Care | Payer: BC Managed Care – PPO | Source: Ambulatory Visit | Attending: Orthopedic Surgery | Admitting: Orthopedic Surgery

## 2022-03-19 DIAGNOSIS — M25562 Pain in left knee: Secondary | ICD-10-CM | POA: Diagnosis not present

## 2022-03-27 DIAGNOSIS — M1712 Unilateral primary osteoarthritis, left knee: Secondary | ICD-10-CM | POA: Diagnosis not present

## 2022-04-14 ENCOUNTER — Encounter: Payer: BC Managed Care – PPO | Admitting: Family Medicine

## 2022-04-17 ENCOUNTER — Ambulatory Visit: Payer: BC Managed Care – PPO | Admitting: Dermatology

## 2022-04-17 VITALS — BP 107/67 | HR 80

## 2022-04-17 DIAGNOSIS — Z7189 Other specified counseling: Secondary | ICD-10-CM

## 2022-04-17 DIAGNOSIS — D179 Benign lipomatous neoplasm, unspecified: Secondary | ICD-10-CM | POA: Diagnosis not present

## 2022-04-17 NOTE — Patient Instructions (Addendum)
 Pre-Operative Instructions  You are scheduled for a surgical procedure at [facility]. We recommend you read the following instructions. If you have any questions or concerns, please call the office at [phone].  Shower and wash the entire body with soap and water the day of your surgery paying special attention to cleansing at and around the planned surgery site.  Avoid aspirin or aspirin containing products at least fourteen (14) days prior to your surgical procedure and for at least one week (7 Days) after your surgical procedure. If you take aspirin on a regular basis for heart disease or history of stroke or for any other reason, we may recommend you continue taking aspirin but please notify us if you take this on a regular basis. Aspirin can cause more bleeding to occur during surgery as well as prolonged bleeding and bruising after surgery.   Avoid other nonsteroidal pain medications at least one week prior to surgery and at least one week prior to your surgery. These include medications such as Ibuprofen (Motrin, Advil and Nuprin), Naprosyn, Voltaren, Relafen, etc. If medications are used for therapeutic reasons, please inform us as they can cause increased bleeding or prolonged bleeding during and bruising after surgical procedures.   Please advise us if you are taking any "blood thinner" medications such as Coumadin or Dipyridamole or Plavix or similar medications. These cause increased bleeding and prolonged bleeding during procedures and bruising after surgical procedures. We may have to consider discontinuing these medications briefly prior to and shortly after your surgery if safe to do so.   Please inform us of all medications you are currently taking. All medications that are taken regularly should be taken the day of surgery as you always do. Nevertheless, we need to be informed of what medications you are taking prior to surgery to know whether they will affect the  procedure or cause any complications.   Please inform us of any medication allergies. Also inform us of whether you have allergies to Latex or rubber products or whether you have had any adverse reaction to Lidocaine or Epinephrine.  Please inform us of any prosthetic or artificial body parts such as artificial heart valve, joint replacements, etc., or similar condition that might require preoperative antibiotics.   We recommend avoidance of alcohol at least two weeks prior to surgery and continued avoidance for at least two weeks after surgery.   We recommend discontinuation of tobacco smoking at least two weeks prior to surgery and continued abstinence for at least two weeks after surgery.  Do not plan strenuous exercise, strenuous work or strenuous lifting for approximately four weeks after your surgery.   We request if you are unable to make your scheduled surgical appointment, please call us at least a week in advance or as soon as you are aware of a problem so that we can cancel or reschedule the appointment.   You MAY TAKE TYLENOL (acetaminophen) for pain as it is not a blood thinner.   PLEASE PLAN TO BE IN TOWN FOR TWO WEEKS FOLLOWING SURGERY, THIS IS IMPORTANT SO YOU CAN BE CHECKED FOR DRESSING CHANGES, SUTURE REMOVAL AND TO MONITOR FOR POSSIBLE COMPLICATIONS.    Due to recent changes in healthcare laws, you may see results of your pathology and/or laboratory studies on MyChart before the doctors have had a chance to review them. We understand that in some cases there may be results that are confusing or concerning to you. Please understand that not all results are   received at the same time and often the doctors may need to interpret multiple results in order to provide you with the best plan of care or course of treatment. Therefore, we ask that you please give us 2 business days to thoroughly review all your results before contacting the office for clarification. Should we see a  critical lab result, you will be contacted sooner.   If You Need Anything After Your Visit  If you have any questions or concerns for your doctor, please call our main line at [phone] and press option 4 to reach your doctor's medical assistant. If no one answers, please leave a voicemail as directed and we will return your call as soon as possible. Messages left after 4 pm will be answered the following business day.   You may also send us a message via MyChart. We typically respond to MyChart messages within 1-2 business days.  For prescription refills, please ask your pharmacy to contact our office. Our fax number is [phone].  If you have an urgent issue when the clinic is closed that cannot wait until the next business day, you can page your doctor at the number below.    Please note that while we do our best to be available for urgent issues outside of office hours, we are not available 24/7.   If you have an urgent issue and are unable to reach us, you may choose to seek medical care at your doctor's office, retail clinic, urgent care center, or emergency room.  If you have a medical emergency, please immediately call 911 or go to the emergency department.  Pager Numbers  - Dr. Kowalski: [phone]  - Dr. Moye: [phone]  - Dr. Stewart: [phone]  In the event of inclement weather, please call our main line at [phone] for an update on the status of any delays or closures.  Dermatology Medication Tips: Please keep the boxes that topical medications come in in order to help keep track of the instructions about where and how to use these. Pharmacies typically print the medication instructions only on the boxes and not directly on the medication tubes.   If your medication is too expensive, please contact our office at [phone] option 4 or send us a message through MyChart.   We are unable to tell what your co-pay for medications will be in advance as  this is different depending on your insurance coverage. However, we may be able to find a substitute medication at lower cost or fill out paperwork to get insurance to cover a needed medication.   If a prior authorization is required to get your medication covered by your insurance company, please allow us 1-2 business days to complete this process.  Drug prices often vary depending on where the prescription is filled and some pharmacies may offer cheaper prices.  The website www.goodrx.com contains coupons for medications through different pharmacies. The prices here do not account for what the cost may be with help from insurance (it may be cheaper with your insurance), but the website can give you the price if you did not use any insurance.  - You can print the associated coupon and take it with your prescription to the pharmacy.  - You may also stop by our office during regular business hours and pick up a GoodRx coupon card.  - If you need your prescription sent electronically to a different pharmacy, notify our office through [facility] MyChart or by phone at [phone] option 4.       Si Usted Necesita Algo Despus de Su Visita  Tambin puede enviarnos un mensaje a travs de MyChart. Por lo general respondemos a los mensajes de MyChart en el transcurso de 1 a 2 das hbiles.  Para renovar recetas, por favor pida a su farmacia que se ponga en contacto con nuestra oficina. Nuestro nmero de fax es el [phone].  Si tiene un asunto urgente cuando la clnica est cerrada y que no puede esperar hasta el siguiente da hbil, puede llamar/localizar a su doctor(a) al nmero que aparece a continuacin.   Por favor, tenga en cuenta que aunque hacemos todo lo posible para estar disponibles para asuntos urgentes fuera del horario de oficina, no estamos disponibles las 24 horas del da, los 7 das de la semana.   Si tiene un problema urgente y no puede comunicarse con nosotros, puede optar por  buscar atencin mdica  en el consultorio de su doctor(a), en una clnica privada, en un centro de atencin urgente o en una sala de emergencias.  Si tiene una emergencia mdica, por favor llame inmediatamente al 911 o vaya a la sala de emergencias.  Nmeros de bper  - Dr. Kowalski: [phone]  - Dra. Moye: [phone]  - Dra. Stewart: [phone]  En caso de inclemencias del tiempo, por favor llame a nuestra lnea principal al [phone] para una actualizacin sobre el estado de cualquier retraso o cierre.  Consejos para la medicacin en dermatologa: Por favor, guarde las cajas en las que vienen los medicamentos de uso tpico para ayudarle a seguir las instrucciones sobre dnde y cmo usarlos. Las farmacias generalmente imprimen las instrucciones del medicamento slo en las cajas y no directamente en los tubos del medicamento.   Si su medicamento es muy caro, por favor, pngase en contacto con nuestra oficina llamando al [phone] y presione la opcin 4 o envenos un mensaje a travs de MyChart.   No podemos decirle cul ser su copago por los medicamentos por adelantado ya que esto es diferente dependiendo de la cobertura de su seguro. Sin embargo, es posible que podamos encontrar un medicamento sustituto a menor costo o llenar un formulario para que el seguro cubra el medicamento que se considera necesario.   Si se requiere una autorizacin previa para que su compaa de seguros cubra su medicamento, por favor permtanos de 1 a 2 das hbiles para completar este proceso.  Los precios de los medicamentos varan con frecuencia dependiendo del lugar de dnde se surte la receta y alguna farmacias pueden ofrecer precios ms baratos.  El sitio web www.goodrx.com tiene cupones para medicamentos de diferentes farmacias. Los precios aqu no tienen en cuenta lo que podra costar con la ayuda del seguro (puede ser ms barato con su seguro), pero el sitio web puede darle el precio si no  utiliz ningn seguro.  - Puede imprimir el cupn correspondiente y llevarlo con su receta a la farmacia.  - Tambin puede pasar por nuestra oficina durante el horario de atencin regular y recoger una tarjeta de cupones de GoodRx.  - Si necesita que su receta se enve electrnicamente a una farmacia diferente, informe a nuestra oficina a travs de MyChart de [facility] o por telfono llamando al [phone] y presione la opcin 4.  

## 2022-04-17 NOTE — Progress Notes (Signed)
   New Patient Visit   Subjective  Marcus Clayton is a 42 y.o. male who presents for the following: Multiple lipomas scattered on the arms that are painful when he accidentally hits them at work, and patient would like to discuss treatment options.  The following portions of the chart were reviewed this encounter and updated as appropriate: medications, allergies, medical history  Review of Systems:  No other skin or systemic complaints except as noted in HPI or Assessment and Plan.  Objective  Well appearing patient in no apparent distress; mood and affect are within normal limits. A focused examination was performed of the following areas: the face and extremities Relevant exam findings are noted in the Assessment and Plan.   Assessment & Plan   Benign hereditary Lipomatosis - symptomatic, accidentally hits at work  Exam: Subcutaneous rubbery nodule(s) Location: L tricep, R tricep, R forearm  Counseling and coordination of care Benign-appearing. Exam most consistent with a lipoma. Discussed that a lipoma is a benign fatty growth that can grow over time and sometimes get irritated. Recommend observation if it is not bothersome or changing. Discussed option of ILK injections or surgical excision to remove it if it is growing, symptomatic, or other changes noted. Please call for new or changing lesions so they can be evaluated.   Return for surgery of lipoma.  Luther Redo, CMA, am acting as scribe for Sarina Ser, MD .  Documentation: I have reviewed the above documentation for accuracy and completeness, and I agree with the above.  Sarina Ser, MD

## 2022-04-18 ENCOUNTER — Encounter: Payer: Self-pay | Admitting: Dermatology

## 2022-05-12 DIAGNOSIS — Z79899 Other long term (current) drug therapy: Secondary | ICD-10-CM | POA: Diagnosis not present

## 2022-05-12 DIAGNOSIS — K580 Irritable bowel syndrome with diarrhea: Secondary | ICD-10-CM | POA: Diagnosis not present

## 2022-05-12 DIAGNOSIS — R197 Diarrhea, unspecified: Secondary | ICD-10-CM | POA: Diagnosis not present

## 2022-05-13 ENCOUNTER — Ambulatory Visit (INDEPENDENT_AMBULATORY_CARE_PROVIDER_SITE_OTHER): Payer: BC Managed Care – PPO | Admitting: Dermatology

## 2022-05-13 ENCOUNTER — Encounter: Payer: Self-pay | Admitting: Dermatology

## 2022-05-13 DIAGNOSIS — D1722 Benign lipomatous neoplasm of skin and subcutaneous tissue of left arm: Secondary | ICD-10-CM

## 2022-05-13 DIAGNOSIS — R197 Diarrhea, unspecified: Secondary | ICD-10-CM | POA: Diagnosis not present

## 2022-05-13 DIAGNOSIS — D485 Neoplasm of uncertain behavior of skin: Secondary | ICD-10-CM

## 2022-05-13 MED ORDER — DOXYCYCLINE HYCLATE 100 MG PO TABS
100.0000 mg | ORAL_TABLET | Freq: Every day | ORAL | 0 refills | Status: AC
Start: 2022-05-13 — End: 2022-05-20

## 2022-05-13 MED ORDER — MUPIROCIN 2 % EX OINT: 1.0000 | TOPICAL_OINTMENT | Freq: Every day | CUTANEOUS | 0 refills | Status: DC

## 2022-05-13 NOTE — Progress Notes (Signed)
   Follow-Up Visit   Subjective  Marcus Clayton is a 42 y.o. male who presents for the following: Excision of lipoma vs other of left upper arm - excise today  The following portions of the chart were reviewed this encounter and updated as appropriate: medications, allergies, medical history  Review of Systems:  No other skin or systemic complaints except as noted in HPI or Assessment and Plan.  Objective  Well appearing patient in no apparent distress; mood and affect are within normal limits.  A focused examination was performed of the following areas: Left arm  Relevant physical exam findings are noted in the Assessment and Plan.   Left tricep near elbow Rubbery nodule   Assessment & Plan   Neoplasm of uncertain behavior of skin Left tricep near elbow  Skin excision  Lesion length (cm):  6 Lesion width (cm):  4.5 Margin per side (cm):  0 Total excision diameter (cm):  6 Informed consent: discussed and consent obtained   Timeout: patient name, date of birth, surgical site, and procedure verified   Procedure prep:  Patient was prepped and draped in usual sterile fashion Prep type:  Isopropyl alcohol and povidone-iodine Anesthesia: the lesion was anesthetized in a standard fashion   Anesthetic:  1% lidocaine w/ epinephrine 1-100,000 buffered w/ 8.4% NaHCO3 Instrument used: #15 blade   Hemostasis achieved with: pressure   Hemostasis achieved with comment:  Electrocautery Outcome: patient tolerated procedure well with no complications   Post-procedure details: sterile dressing applied and wound care instructions given   Dressing type: bandage and pressure dressing (mupirocin)    Skin repair Complexity:  Complex Final length (cm):  4 Reason for type of repair: reduce tension to allow closure, reduce the risk of dehiscence, infection, and necrosis, reduce subcutaneous dead space and avoid a hematoma, allow closure of the large defect, preserve normal anatomy, preserve  normal anatomical and functional relationships and enhance both functionality and cosmetic results   Undermining: area extensively undermined   Undermining comment:  Undermining defect 6.0 cm Subcutaneous layers (deep stitches):  Suture size:  3-0 Suture type: Vicryl (polyglactin 910)   Subcutaneous suture technique: inverted dermal. Fine/surface layer approximation (top stitches):  Suture size:  3-0 Suture type: nylon   Stitches: simple running   Suture removal (days):  7 Hemostasis achieved with: suture and pressure Outcome: patient tolerated procedure well with no complications   Post-procedure details: sterile dressing applied and wound care instructions given   Dressing type: bandage and pressure dressing (mupirocin)    mupirocin ointment (BACTROBAN) 2 % Apply 1 Application topically daily. With dressing changes  doxycycline (VIBRA-TABS) 100 MG tablet Take 1 tablet (100 mg total) by mouth daily for 7 days. With food and plenty of fluid  Specimen 1 - Surgical pathology Differential Diagnosis: Lipoma vs other  Check Margins: No  Mupirocin ointment qd with dressing changes Doxycycline 100 mg 1 po qd with food and plenty of fluid x 7 days   Return in about 1 week (around 05/20/2022) for suture removal.  I, Joanie Coddington, CMA, am acting as scribe for Armida Sans, MD .  Documentation: I have reviewed the above documentation for accuracy and completeness, and I agree with the above.  Armida Sans, MD

## 2022-05-13 NOTE — Patient Instructions (Signed)

## 2022-05-20 ENCOUNTER — Encounter: Payer: Self-pay | Admitting: Dermatology

## 2022-05-20 ENCOUNTER — Ambulatory Visit (INDEPENDENT_AMBULATORY_CARE_PROVIDER_SITE_OTHER): Payer: BC Managed Care – PPO | Admitting: Dermatology

## 2022-05-20 DIAGNOSIS — Z4802 Encounter for removal of sutures: Secondary | ICD-10-CM

## 2022-05-20 DIAGNOSIS — D179 Benign lipomatous neoplasm, unspecified: Secondary | ICD-10-CM

## 2022-05-20 NOTE — Patient Instructions (Signed)
Due to recent changes in healthcare laws, you may see results of your pathology and/or laboratory studies on MyChart before the doctors have had a chance to review them. We understand that in some cases there may be results that are confusing or concerning to you. Please understand that not all results are received at the same time and often the doctors may need to interpret multiple results in order to provide you with the best plan of care or course of treatment. Therefore, we ask that you please give us 2 business days to thoroughly review all your results before contacting the office for clarification. Should we see a critical lab result, you will be contacted sooner.   If You Need Anything After Your Visit  If you have any questions or concerns for your doctor, please call our main line at [phone] and press option 4 to reach your doctor's medical assistant. If no one answers, please leave a voicemail as directed and we will return your call as soon as possible. Messages left after 4 pm will be answered the following business day.   You may also send us a message via MyChart. We typically respond to MyChart messages within 1-2 business days.  For prescription refills, please ask your pharmacy to contact our office. Our fax number is [phone].  If you have an urgent issue when the clinic is closed that cannot wait until the next business day, you can page your doctor at the number below.    Please note that while we do our best to be available for urgent issues outside of office hours, we are not available 24/7.   If you have an urgent issue and are unable to reach us, you may choose to seek medical care at your doctor's office, retail clinic, urgent care center, or emergency room.  If you have a medical emergency, please immediately call 911 or go to the emergency department.  Pager Numbers  - Dr. Kowalski: [phone]  - Dr. Moye: [phone]  - Dr. Stewart:  [phone]  In the event of inclement weather, please call our main line at [phone] for an update on the status of any delays or closures.  Dermatology Medication Tips: Please keep the boxes that topical medications come in in order to help keep track of the instructions about where and how to use these. Pharmacies typically print the medication instructions only on the boxes and not directly on the medication tubes.   If your medication is too expensive, please contact our office at [phone] option 4 or send us a message through MyChart.   We are unable to tell what your co-pay for medications will be in advance as this is different depending on your insurance coverage. However, we may be able to find a substitute medication at lower cost or fill out paperwork to get insurance to cover a needed medication.   If a prior authorization is required to get your medication covered by your insurance company, please allow us 1-2 business days to complete this process.  Drug prices often vary depending on where the prescription is filled and some pharmacies may offer cheaper prices.  The website www.goodrx.com contains coupons for medications through different pharmacies. The prices here do not account for what the cost may be with help from insurance (it may be cheaper with your insurance), but the website can give you the price if you did not use any insurance.  - You can print the associated coupon and take it with   your prescription to the pharmacy.  - You may also stop by our office during regular business hours and pick up a GoodRx coupon card.  - If you need your prescription sent electronically to a different pharmacy, notify our office through [facility] MyChart or by phone at [phone] option 4.     Si Usted Necesita Algo Despus de Su Visita  Tambin puede enviarnos un mensaje a travs de MyChart. Por lo general respondemos a los mensajes de MyChart en el transcurso de 1 a 2  das hbiles.  Para renovar recetas, por favor pida a su farmacia que se ponga en contacto con nuestra oficina. Nuestro nmero de fax es el [phone].  Si tiene un asunto urgente cuando la clnica est cerrada y que no puede esperar hasta el siguiente da hbil, puede llamar/localizar a su doctor(a) al nmero que aparece a continuacin.   Por favor, tenga en cuenta que aunque hacemos todo lo posible para estar disponibles para asuntos urgentes fuera del horario de oficina, no estamos disponibles las 24 horas del da, los 7 das de la semana.   Si tiene un problema urgente y no puede comunicarse con nosotros, puede optar por buscar atencin mdica  en el consultorio de su doctor(a), en una clnica privada, en un centro de atencin urgente o en una sala de emergencias.  Si tiene una emergencia mdica, por favor llame inmediatamente al 911 o vaya a la sala de emergencias.  Nmeros de bper  - Dr. Kowalski: [phone]  - Dra. Moye: [phone]  - Dra. Stewart: [phone]  En caso de inclemencias del tiempo, por favor llame a nuestra lnea principal al [phone] para una actualizacin sobre el estado de cualquier retraso o cierre.  Consejos para la medicacin en dermatologa: Por favor, guarde las cajas en las que vienen los medicamentos de uso tpico para ayudarle a seguir las instrucciones sobre dnde y cmo usarlos. Las farmacias generalmente imprimen las instrucciones del medicamento slo en las cajas y no directamente en los tubos del medicamento.   Si su medicamento es muy caro, por favor, pngase en contacto con nuestra oficina llamando al [phone] y presione la opcin 4 o envenos un mensaje a travs de MyChart.   No podemos decirle cul ser su copago por los medicamentos por adelantado ya que esto es diferente dependiendo de la cobertura de su seguro. Sin embargo, es posible que podamos encontrar un medicamento sustituto a menor costo o llenar un formulario para que el  seguro cubra el medicamento que se considera necesario.   Si se requiere una autorizacin previa para que su compaa de seguros cubra su medicamento, por favor permtanos de 1 a 2 das hbiles para completar este proceso.  Los precios de los medicamentos varan con frecuencia dependiendo del lugar de dnde se surte la receta y alguna farmacias pueden ofrecer precios ms baratos.  El sitio web www.goodrx.com tiene cupones para medicamentos de diferentes farmacias. Los precios aqu no tienen en cuenta lo que podra costar con la ayuda del seguro (puede ser ms barato con su seguro), pero el sitio web puede darle el precio si no utiliz ningn seguro.  - Puede imprimir el cupn correspondiente y llevarlo con su receta a la farmacia.  - Tambin puede pasar por nuestra oficina durante el horario de atencin regular y recoger una tarjeta de cupones de GoodRx.  - Si necesita que su receta se enve electrnicamente a una farmacia diferente, informe a nuestra oficina a travs de MyChart de [facility]   o por telfono llamando al [phone] y presione la opcin 4.  

## 2022-05-20 NOTE — Progress Notes (Signed)
   Follow-Up Visit   Subjective  Marcus Clayton is a 41 y.o. male who presents for the following: Suture removal  Pathology showed angiolipoma  The following portions of the chart were reviewed this encounter and updated as appropriate: medications, allergies, medical history  Review of Systems:  No other skin or systemic complaints except as noted in HPI or Assessment and Plan.  Objective  Well appearing patient in no apparent distress; mood and affect are within normal limits.  Areas Examined: Left arm Relevant physical exam findings are noted in the Assessment and Plan.    Assessment & Plan    Encounter for Removal of Sutures - Incision site is clean, dry and intact. - Wound cleansed, sutures removed, wound cleansed and steri strips applied.  - Discussed pathology results showing angiolipoma - Patient advised to keep steri-strips dry until they fall off. - Scars remodel for a full year. - Once steri-strips fall off, patient can apply over-the-counter silicone scar cream once to twice a day to help with scar remodeling if desired. - Patient advised to call with any concerns or if they notice any new or changing lesions.  Return if symptoms worsen or fail to improve.  I, Ardis Rowan, RMA, am acting as scribe for Armida Sans, MD .   Documentation: I have reviewed the above documentation for accuracy and completeness, and I agree with the above.  Armida Sans, MD

## 2022-05-25 ENCOUNTER — Encounter: Payer: Self-pay | Admitting: Dermatology

## 2022-06-30 ENCOUNTER — Encounter: Payer: Self-pay | Admitting: Family Medicine

## 2022-06-30 ENCOUNTER — Ambulatory Visit: Payer: BC Managed Care – PPO | Admitting: Family Medicine

## 2022-06-30 ENCOUNTER — Telehealth: Payer: Self-pay | Admitting: Family Medicine

## 2022-06-30 VITALS — BP 120/82 | HR 76 | Temp 98.3°F | Ht 72.0 in | Wt 205.8 lb

## 2022-06-30 DIAGNOSIS — F419 Anxiety disorder, unspecified: Secondary | ICD-10-CM | POA: Diagnosis not present

## 2022-06-30 DIAGNOSIS — M1711 Unilateral primary osteoarthritis, right knee: Secondary | ICD-10-CM | POA: Insufficient documentation

## 2022-06-30 DIAGNOSIS — G4733 Obstructive sleep apnea (adult) (pediatric): Secondary | ICD-10-CM | POA: Insufficient documentation

## 2022-06-30 DIAGNOSIS — G479 Sleep disorder, unspecified: Secondary | ICD-10-CM | POA: Diagnosis not present

## 2022-06-30 DIAGNOSIS — F32A Depression, unspecified: Secondary | ICD-10-CM

## 2022-06-30 MED ORDER — DULOXETINE HCL 30 MG PO CPEP
ORAL_CAPSULE | ORAL | 1 refills | Status: DC
Start: 2022-06-30 — End: 2022-12-08

## 2022-06-30 NOTE — Progress Notes (Signed)
Marikay Alar, MD Phone: [phone]  Marcus Clayton is a 42 y.o. male who presents today for f/u.  Anxiety/depression: Patient notes this is better than it was though he does still continue to have some issues with both of these things.  No SI.  He continues on Zoloft.  He notes he only gets 3 to 4 hours of sleep nightly.  He also reports lack of sex drive.  He has no issues getting an erection.  He does drink caffeine until about 5 PM.  He goes to bed between 9 and 10 and typically wakes up around 3 AM.  He does work second shift 3 days a week.  OSA: Patient notes he never ended up getting a CPAP.  He had a sleep study that revealed sleep apnea back in October.  Left knee OA: Patient has been seeing orthopedics.  He notes discomfort in his left knee most days.  Social History   Tobacco Use  Smoking Status Former   Packs/day: 1.00   Years: 5.00   Additional pack years: 0.00   Total pack years: 5.00   Types: Cigarettes   Quit date: 04/10/2019   Years since quitting: 3.2  Smokeless Tobacco Never    Current Outpatient Medications on File Prior to Visit  Medication Sig Dispense Refill   mupirocin ointment (BACTROBAN) 2 % Apply 1 Application topically daily. With dressing changes 22 g 0   testosterone cypionate (DEPOTESTOSTERONE CYPIONATE) 200 MG/ML injection INJECT 1 ML (200 MG TOTAL) INTO THE MUSCLE EVERY 14 DAYS     Vitamin D, Ergocalciferol, (DRISDOL) 1.25 MG (50000 UNIT) CAPS capsule Take by mouth.     No current facility-administered medications on file prior to visit.     ROS see history of present illness  Objective  Physical Exam Vitals:   06/30/22 0928  BP: 120/82  Pulse: 76  Temp: 98.3 F (36.8 C)  SpO2: 97%    BP Readings from Last 3 Encounters:  06/30/22 120/82  04/17/22 107/67  12/25/21 120/76   Wt Readings from Last 3 Encounters:  06/30/22 205 lb 12.8 oz (93.4 kg)  12/25/21 204 lb 3.2 oz (92.6 kg)  11/11/21 194 lb (88 kg)    Physical  Exam Constitutional:      General: He is not in acute distress.    Appearance: He is not diaphoretic.  Cardiovascular:     Rate and Rhythm: Normal rate and regular rhythm.     Heart sounds: Normal heart sounds.  Pulmonary:     Effort: Pulmonary effort is normal.     Breath sounds: Normal breath sounds.  Skin:    General: Skin is warm and dry.  Neurological:     Mental Status: He is alert.      Assessment/Plan: Please see individual problem list.  Anxiety and depression Assessment & Plan: Chronic issue.  Suboptimally controlled.  Patient is also having some side effects from the Zoloft.  We will switch Zoloft to Cymbalta.  He will start on Cymbalta 30 mg daily for 2 weeks and then increase to 60 mg daily.  He will follow-up in 2 months.  If he has any side effects with this he will let us know.  Orders: -     DULoxetine HCl; Take 1 capsule (30 mg total) by mouth daily for 14 days, THEN 2 capsules (60 mg total) daily.  Dispense: 180 capsule; Refill: 1  Difficulty sleeping Assessment & Plan: Chronic issue.  Possibly related to anxiety and depression.  We  are going to switch his medicine for anxiety and depression see if we get this under better control to help with sleep.  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  If needed moving forward we could consider trazodone for help with sleep.   Localized osteoarthritis of right knee Assessment & Plan: Chronic issue.  He will continue to see orthopedics.  Will see if adding Cymbalta will help with his pain.   OSA (obstructive sleep apnea) Assessment & Plan: Chronic issue.  Patient never received CPAP.  I will somebody call [employer] to determine if they ever received the order for this.      Return in about 2 months (around 08/30/2022) for Anxiety/depression.   Marikay Alar, MD [facility]

## 2022-06-30 NOTE — Assessment & Plan Note (Signed)
Chronic issue.  Patient never received CPAP.  I will somebody call [employer] to determine if they ever received the order for this.

## 2022-06-30 NOTE — Patient Instructions (Signed)
Nice to see you. We are going to switch you to Cymbalta.  You can start the Cymbalta in place of the Zoloft once you receive the Cymbalta.  Once starting the Cymbalta you will no longer takes Zoloft. Please cut out caffeine after noon. If your decreased sexual desire does not resolve with switching to a new medicine from the Zoloft please let us know.

## 2022-06-30 NOTE — Assessment & Plan Note (Signed)
Chronic issue.  Suboptimally controlled.  Patient is also having some side effects from the Zoloft.  We will switch Zoloft to Cymbalta.  He will start on Cymbalta 30 mg daily for 2 weeks and then increase to 60 mg daily.  He will follow-up in 2 months.  If he has any side effects with this he will let us know.

## 2022-06-30 NOTE — Telephone Encounter (Signed)
Patient had a sleep study in October or November of last year.  This did reveal sleep apnea.  It looks like an order was signed for [facility] for an AutoPap.  Patient reports he never received this.  Can you call [facility] and see if they received this order?  If they did receive it can you find out if they know why the patient did not get the CPAP.  If they did not receive it we will need to refax the order.

## 2022-06-30 NOTE — Assessment & Plan Note (Signed)
Chronic issue.  Possibly related to anxiety and depression.  We are going to switch his medicine for anxiety and depression see if we get this under better control to help with sleep.  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  If needed moving forward we could consider trazodone for help with sleep.

## 2022-06-30 NOTE — Assessment & Plan Note (Signed)
Chronic issue.  He will continue to see orthopedics.  Will see if adding Cymbalta will help with his pain.

## 2022-07-02 NOTE — Telephone Encounter (Signed)
Sent request electronically.

## 2022-07-14 DIAGNOSIS — K580 Irritable bowel syndrome with diarrhea: Secondary | ICD-10-CM | POA: Diagnosis not present

## 2022-07-15 DIAGNOSIS — Z6828 Body mass index (BMI) 28.0-28.9, adult: Secondary | ICD-10-CM | POA: Diagnosis not present

## 2022-07-15 DIAGNOSIS — Z1331 Encounter for screening for depression: Secondary | ICD-10-CM | POA: Diagnosis not present

## 2022-07-15 DIAGNOSIS — Z0001 Encounter for general adult medical examination with abnormal findings: Secondary | ICD-10-CM | POA: Diagnosis not present

## 2022-07-15 DIAGNOSIS — Z13220 Encounter for screening for lipoid disorders: Secondary | ICD-10-CM | POA: Diagnosis not present

## 2022-07-16 DIAGNOSIS — Z0001 Encounter for general adult medical examination with abnormal findings: Secondary | ICD-10-CM | POA: Diagnosis not present

## 2022-07-16 DIAGNOSIS — Z13220 Encounter for screening for lipoid disorders: Secondary | ICD-10-CM | POA: Diagnosis not present

## 2022-07-17 DIAGNOSIS — E349 Endocrine disorder, unspecified: Secondary | ICD-10-CM | POA: Diagnosis not present

## 2022-07-17 DIAGNOSIS — Z79899 Other long term (current) drug therapy: Secondary | ICD-10-CM | POA: Diagnosis not present

## 2022-07-17 DIAGNOSIS — E559 Vitamin D deficiency, unspecified: Secondary | ICD-10-CM | POA: Diagnosis not present

## 2022-09-01 ENCOUNTER — Encounter: Payer: Self-pay | Admitting: Family Medicine

## 2022-09-01 ENCOUNTER — Ambulatory Visit: Payer: BC Managed Care – PPO | Admitting: Family Medicine

## 2022-09-01 VITALS — BP 110/70 | HR 77 | Temp 97.8°F | Ht 71.0 in | Wt 207.0 lb

## 2022-09-01 DIAGNOSIS — R413 Other amnesia: Secondary | ICD-10-CM | POA: Diagnosis not present

## 2022-09-01 DIAGNOSIS — F419 Anxiety disorder, unspecified: Secondary | ICD-10-CM | POA: Diagnosis not present

## 2022-09-01 DIAGNOSIS — R6882 Decreased libido: Secondary | ICD-10-CM

## 2022-09-01 DIAGNOSIS — F32A Depression, unspecified: Secondary | ICD-10-CM

## 2022-09-01 DIAGNOSIS — M25562 Pain in left knee: Secondary | ICD-10-CM | POA: Diagnosis not present

## 2022-09-01 DIAGNOSIS — E291 Testicular hypofunction: Secondary | ICD-10-CM

## 2022-09-01 DIAGNOSIS — G8929 Other chronic pain: Secondary | ICD-10-CM

## 2022-09-01 DIAGNOSIS — M069 Rheumatoid arthritis, unspecified: Secondary | ICD-10-CM | POA: Insufficient documentation

## 2022-09-01 NOTE — Assessment & Plan Note (Signed)
Chronic issue.  Well-controlled.  Patient will continue on Cymbalta 60 mg daily.  Did discuss that the Cymbalta could be contributing to his reduced libido though patient notes that had been going on long before taking Cymbalta.
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
## 2022-09-01 NOTE — Assessment & Plan Note (Signed)
Chronic issue.  Discussed this could be related to medications, depression/anxiety, or interpersonal relationship issues.  Discussed he could also be related to his testosterone deficiency.  Check testosterone level today as this has not been checked while on therapy.  Encourage patient to consider marriage counseling or sexual therapy.

## 2022-09-01 NOTE — Assessment & Plan Note (Signed)
Chronic issue.  Referred to PT.
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
## 2022-09-30 ENCOUNTER — Encounter (HOSPITAL_BASED_OUTPATIENT_CLINIC_OR_DEPARTMENT_OTHER): Payer: Self-pay

## 2022-09-30 ENCOUNTER — Ambulatory Visit (HOSPITAL_BASED_OUTPATIENT_CLINIC_OR_DEPARTMENT_OTHER): Payer: BC Managed Care – PPO | Attending: Family Medicine | Admitting: Physical Therapy

## 2022-09-30 DIAGNOSIS — M6281 Muscle weakness (generalized): Secondary | ICD-10-CM | POA: Insufficient documentation

## 2022-09-30 DIAGNOSIS — M25562 Pain in left knee: Secondary | ICD-10-CM | POA: Insufficient documentation

## 2022-09-30 DIAGNOSIS — G8929 Other chronic pain: Secondary | ICD-10-CM | POA: Insufficient documentation

## 2022-10-09 ENCOUNTER — Ambulatory Visit (HOSPITAL_BASED_OUTPATIENT_CLINIC_OR_DEPARTMENT_OTHER): Payer: BC Managed Care – PPO | Admitting: Physical Therapy

## 2022-10-09 ENCOUNTER — Other Ambulatory Visit: Payer: Self-pay

## 2022-10-09 DIAGNOSIS — M25562 Pain in left knee: Secondary | ICD-10-CM

## 2022-10-09 DIAGNOSIS — M6281 Muscle weakness (generalized): Secondary | ICD-10-CM | POA: Diagnosis not present

## 2022-10-09 DIAGNOSIS — G8929 Other chronic pain: Secondary | ICD-10-CM | POA: Diagnosis not present
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
## 2022-10-10 ENCOUNTER — Encounter (HOSPITAL_BASED_OUTPATIENT_CLINIC_OR_DEPARTMENT_OTHER): Payer: Self-pay | Admitting: Physical Therapy

## 2022-10-15 ENCOUNTER — Ambulatory Visit (HOSPITAL_BASED_OUTPATIENT_CLINIC_OR_DEPARTMENT_OTHER): Payer: Self-pay | Admitting: Physical Therapy

## 2022-10-15 DIAGNOSIS — G8929 Other chronic pain: Secondary | ICD-10-CM | POA: Diagnosis not present

## 2022-10-15 DIAGNOSIS — M6281 Muscle weakness (generalized): Secondary | ICD-10-CM

## 2022-10-15 DIAGNOSIS — M25562 Pain in left knee: Secondary | ICD-10-CM
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
## 2022-10-16 ENCOUNTER — Encounter (HOSPITAL_BASED_OUTPATIENT_CLINIC_OR_DEPARTMENT_OTHER): Payer: Self-pay | Admitting: Physical Therapy

## 2022-10-23 ENCOUNTER — Ambulatory Visit (HOSPITAL_BASED_OUTPATIENT_CLINIC_OR_DEPARTMENT_OTHER): Payer: BC Managed Care – PPO | Attending: Family Medicine | Admitting: Physical Therapy

## 2022-10-23 DIAGNOSIS — M6281 Muscle weakness (generalized): Secondary | ICD-10-CM | POA: Insufficient documentation

## 2022-10-23 DIAGNOSIS — M25562 Pain in left knee: Secondary | ICD-10-CM | POA: Insufficient documentation
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
## 2022-10-24 ENCOUNTER — Encounter (HOSPITAL_BASED_OUTPATIENT_CLINIC_OR_DEPARTMENT_OTHER): Payer: Self-pay | Admitting: Physical Therapy

## 2022-10-30 ENCOUNTER — Encounter (HOSPITAL_BASED_OUTPATIENT_CLINIC_OR_DEPARTMENT_OTHER): Payer: BC Managed Care – PPO

## 2022-11-06 ENCOUNTER — Encounter (HOSPITAL_BASED_OUTPATIENT_CLINIC_OR_DEPARTMENT_OTHER): Payer: BC Managed Care – PPO | Admitting: Physical Therapy

## 2022-11-13 ENCOUNTER — Encounter (HOSPITAL_BASED_OUTPATIENT_CLINIC_OR_DEPARTMENT_OTHER): Payer: BC Managed Care – PPO | Admitting: Physical Therapy

## 2022-11-13 ENCOUNTER — Encounter (HOSPITAL_BASED_OUTPATIENT_CLINIC_OR_DEPARTMENT_OTHER): Payer: Self-pay

## 2022-11-20 ENCOUNTER — Encounter (HOSPITAL_BASED_OUTPATIENT_CLINIC_OR_DEPARTMENT_OTHER): Payer: BC Managed Care – PPO | Admitting: Physical Therapy

## 2022-12-08 ENCOUNTER — Ambulatory Visit: Payer: BC Managed Care – PPO | Admitting: Neurology

## 2022-12-08 ENCOUNTER — Encounter: Payer: Self-pay | Admitting: Neurology

## 2022-12-08 VITALS — BP 126/73 | HR 84 | Ht 69.0 in | Wt 216.0 lb

## 2022-12-08 DIAGNOSIS — F419 Anxiety disorder, unspecified: Secondary | ICD-10-CM

## 2022-12-08 DIAGNOSIS — R413 Other amnesia: Secondary | ICD-10-CM

## 2022-12-08 DIAGNOSIS — S069XAS Unspecified intracranial injury with loss of consciousness status unknown, sequela: Secondary | ICD-10-CM

## 2022-12-08 MED ORDER — AMANTADINE HCL 100 MG PO CAPS: 100.0000 mg | ORAL_CAPSULE | Freq: Two times a day (BID) | ORAL | 0 refills | Status: DC
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
## 2022-12-08 NOTE — Patient Instructions (Signed)
Start Amantadine 100 mg BID, please contact us for update  Continue your other medications  Head CT for evaluation of encephalomalacia from the TBI  Please contact us for any issues  Return in 6 to 8 months

## 2022-12-09 ENCOUNTER — Encounter: Payer: Self-pay | Admitting: Neurology

## 2022-12-09 ENCOUNTER — Other Ambulatory Visit: Payer: Self-pay | Admitting: Neurology

## 2022-12-09 ENCOUNTER — Encounter: Payer: Self-pay | Admitting: Family Medicine

## 2022-12-09 DIAGNOSIS — S069XAS Unspecified intracranial injury with loss of consciousness status unknown, sequela: Secondary | ICD-10-CM

## 2022-12-09 NOTE — Telephone Encounter (Signed)
CT head ordered

## 2022-12-16 NOTE — Telephone Encounter (Signed)
Marcus Clayton: [id number] exp. 12/16/22-02/13/23 sent to GI [phone]

## 2022-12-23 ENCOUNTER — Encounter: Payer: Self-pay | Admitting: Neurology

## 2023-01-01 ENCOUNTER — Ambulatory Visit
Admission: RE | Admit: 2023-01-01 | Discharge: 2023-01-01 | Disposition: A | Discharge status: Home / Self Care | Payer: BC Managed Care – PPO | Source: Ambulatory Visit | Attending: Neurology | Admitting: Neurology

## 2023-01-01 DIAGNOSIS — S069XAS Unspecified intracranial injury with loss of consciousness status unknown, sequela: Secondary | ICD-10-CM | POA: Diagnosis not present

## 2023-01-01 DIAGNOSIS — R413 Other amnesia: Secondary | ICD-10-CM | POA: Diagnosis not present

## 2023-01-22 ENCOUNTER — Other Ambulatory Visit: Payer: Self-pay | Admitting: Neurology

## 2023-02-18 ENCOUNTER — Other Ambulatory Visit: Payer: Self-pay | Admitting: Neurology

## 2023-03-04 ENCOUNTER — Ambulatory Visit: Payer: BC Managed Care – PPO | Admitting: Family Medicine

## 2023-04-23 ENCOUNTER — Telehealth: Payer: Self-pay | Admitting: Neurology

## 2023-04-23 ENCOUNTER — Other Ambulatory Visit: Payer: Self-pay

## 2023-04-23 NOTE — Progress Notes (Signed)
 Discontinue due to side effects

## 2023-04-23 NOTE — Telephone Encounter (Signed)
 Call to patient who reports since starting the amantadine he feel more short tempered and age aggravated. That has been the only change and only medication started. I discussed with Dr. Teresa Coombs he advised patient to decrease to 1 tablet nightly for next 4 days and then stop. Patient in agreement and verbalized understanding
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
## 2023-05-20 ENCOUNTER — Encounter: Payer: Self-pay | Admitting: Neurology

## 2023-05-21 ENCOUNTER — Other Ambulatory Visit: Payer: Self-pay | Admitting: Neurology

## 2023-05-21 MED ORDER — AMANTADINE HCL 100 MG PO CAPS
100.0000 mg | ORAL_CAPSULE | Freq: Every day | ORAL | 0 refills | Status: AC
Start: 2023-05-21 — End: 2023-06-20

## 2023-07-08 ENCOUNTER — Ambulatory Visit: Payer: BC Managed Care – PPO | Admitting: Neurology

## 2023-07-08 ENCOUNTER — Telehealth: Payer: Self-pay | Admitting: Neurology

## 2023-07-08 ENCOUNTER — Encounter: Payer: Self-pay | Admitting: Neurology

## 2023-07-08 VITALS — BP 132/84 | HR 95 | Resp 15 | Ht 71.0 in | Wt 198.5 lb

## 2023-07-08 DIAGNOSIS — S069XAS Unspecified intracranial injury with loss of consciousness status unknown, sequela: Secondary | ICD-10-CM | POA: Diagnosis not present

## 2023-07-08 DIAGNOSIS — R413 Other amnesia: Secondary | ICD-10-CM | POA: Diagnosis not present

## 2023-07-08 MED ORDER — AMANTADINE HCL 100 MG PO CAPS: 100.0000 mg | ORAL_CAPSULE | Freq: Two times a day (BID) | ORAL | 5 refills | Status: AC

## 2023-07-08 NOTE — Patient Instructions (Signed)
 Continue with amantadine  100 mg twice daily Continue your other medications Referral for formal neuropsychological evaluation Follow-up in 1 year or sooner if worse.

## 2023-07-08 NOTE — Telephone Encounter (Signed)
 Referral for neuropsychology fax to [facility]. Phone: [phone], Fax: [phone]
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
## 2023-07-14 NOTE — Telephone Encounter (Signed)
 Received faxed from  [facility]   Stating PT  Referral was declined.   Called [facility] , Representative informed as of NOV 1  [facility] will no longer accept New PT  ,  Facility is  backed up for a year .( This is for  [place] and [place] location)

## 2023-08-07 NOTE — Telephone Encounter (Signed)
 Is there somewhere else we can send the patient that will take his insurance.

## 2023-08-11 NOTE — Telephone Encounter (Signed)
 Referral for Neuropsychology faxed to [facility]    [facility]  Phone:[phone] [phone]

## 2023-11-09 ENCOUNTER — Other Ambulatory Visit: Payer: Self-pay | Admitting: Orthopedic Surgery

## 2023-11-09 DIAGNOSIS — R5381 Other malaise: Secondary | ICD-10-CM

## 2023-11-11 ENCOUNTER — Other Ambulatory Visit: Payer: Self-pay | Admitting: Orthopedic Surgery

## 2023-11-11 DIAGNOSIS — M25562 Pain in left knee: Secondary | ICD-10-CM

## 2023-11-28 ENCOUNTER — Ambulatory Visit
Admission: RE | Admit: 2023-11-28 | Discharge: 2023-11-28 | Disposition: A | Discharge status: Home / Self Care | Source: Ambulatory Visit | Attending: Orthopedic Surgery | Admitting: Orthopedic Surgery

## 2023-11-28 DIAGNOSIS — M25562 Pain in left knee: Secondary | ICD-10-CM

## 2024-06-22 ENCOUNTER — Ambulatory Visit: Admitting: Neurology

## 2024-07-07 ENCOUNTER — Ambulatory Visit: Admitting: Neurology
